# Patient Record
Sex: Female | Born: 1945 | Race: White | Hispanic: No | Smoking: Never smoker
Health system: Southern US, Community
[De-identification: ages and names within clinical notes are randomized; demographics above are authoritative.]

## PROBLEM LIST (undated history)

## (undated) DIAGNOSIS — I10 Essential (primary) hypertension: Secondary | ICD-10-CM

## (undated) DIAGNOSIS — E039 Hypothyroidism, unspecified: Secondary | ICD-10-CM

## (undated) DIAGNOSIS — R002 Palpitations: Secondary | ICD-10-CM

## (undated) DIAGNOSIS — I4891 Unspecified atrial fibrillation: Secondary | ICD-10-CM

## (undated) DIAGNOSIS — I499 Cardiac arrhythmia, unspecified: Secondary | ICD-10-CM

## (undated) DIAGNOSIS — R0600 Dyspnea, unspecified: Secondary | ICD-10-CM

## (undated) DIAGNOSIS — E119 Type 2 diabetes mellitus without complications: Secondary | ICD-10-CM

## (undated) DIAGNOSIS — R609 Edema, unspecified: Secondary | ICD-10-CM

## (undated) HISTORY — PX: OTHER SURGICAL HISTORY: SHX169

## (undated) HISTORY — PX: TONSILLECTOMY: SUR1361

## (undated) HISTORY — PX: ABDOMINAL HYSTERECTOMY: SHX81

---

## 2016-06-06 ENCOUNTER — Ambulatory Visit: Admit: 2016-06-06 | Payer: Self-pay | Admitting: Ophthalmology

## 2016-06-06 SURGERY — PHACOEMULSIFICATION, CATARACT, WITH IOL INSERTION
Anesthesia: Choice | Laterality: Left

## 2016-07-16 ENCOUNTER — Encounter: Payer: Self-pay | Admitting: *Deleted

## 2016-07-17 NOTE — H&P (Signed)
See scanned note.

## 2016-07-18 ENCOUNTER — Encounter
Admission: RE | Disposition: A | Discharge status: Home / Self Care | Payer: Self-pay | Source: Ambulatory Visit | Attending: Ophthalmology

## 2016-07-18 ENCOUNTER — Ambulatory Visit
Admission: RE | Admit: 2016-07-18 | Discharge: 2016-07-18 | Disposition: A | Discharge status: Home / Self Care | Payer: Medicare Other | Source: Ambulatory Visit | Attending: Ophthalmology | Admitting: Ophthalmology

## 2016-07-18 ENCOUNTER — Ambulatory Visit: Payer: Medicare Other | Admitting: *Deleted

## 2016-07-18 DIAGNOSIS — E1036 Type 1 diabetes mellitus with diabetic cataract: Secondary | ICD-10-CM | POA: Insufficient documentation

## 2016-07-18 HISTORY — DX: Edema, unspecified: R60.9

## 2016-07-18 HISTORY — DX: Essential (primary) hypertension: I10

## 2016-07-18 HISTORY — DX: Hypothyroidism, unspecified: E03.9

## 2016-07-18 HISTORY — DX: Type 2 diabetes mellitus without complications: E11.9

## 2016-07-18 HISTORY — PX: CATARACT EXTRACTION W/PHACO: SHX586

## 2016-07-18 HISTORY — DX: Palpitations: R00.2

## 2016-07-18 HISTORY — DX: Cardiac arrhythmia, unspecified: I49.9

## 2016-07-18 LAB — GLUCOSE, CAPILLARY: Glucose-Capillary: 116 mg/dL — ABNORMAL HIGH (ref 65–99)

## 2016-07-18 SURGERY — PHACOEMULSIFICATION, CATARACT, WITH IOL INSERTION
Anesthesia: General | Site: Eye | Laterality: Left | Wound class: Clean

## 2016-07-18 MED ORDER — POVIDONE-IODINE 5 % OP SOLN
OPHTHALMIC | Status: DC | PRN
  Administered 2016-07-18: 1 via OPHTHALMIC

## 2016-07-18 MED ORDER — MIDAZOLAM HCL 2 MG/2ML IJ SOLN
INTRAMUSCULAR | Status: AC
  Filled 2016-07-18: qty 2

## 2016-07-18 MED ORDER — POVIDONE-IODINE 5 % OP SOLN
OPHTHALMIC | Status: AC
Start: 2016-07-18 — End: 2016-07-18
  Filled 2016-07-18: qty 30

## 2016-07-18 MED ORDER — CYCLOPENTOLATE HCL 2 % OP SOLN
OPHTHALMIC | Status: AC
  Filled 2016-07-18: qty 2

## 2016-07-18 MED ORDER — NA CHONDROIT SULF-NA HYALURON 40-17 MG/ML IO SOLN
INTRAOCULAR | Status: DC | PRN
  Administered 2016-07-18: 1 mL via INTRAOCULAR

## 2016-07-18 MED ORDER — MIDAZOLAM HCL 2 MG/2ML IJ SOLN
INTRAMUSCULAR | Status: DC | PRN
  Administered 2016-07-18 (×4): 0.5 mg via INTRAVENOUS

## 2016-07-18 MED ORDER — PHENYLEPHRINE HCL 10 % OP SOLN
1.0000 [drp] | OPHTHALMIC | Status: AC
  Administered 2016-07-18 (×4): 1 [drp] via OPHTHALMIC

## 2016-07-18 MED ORDER — CYCLOPENTOLATE HCL 2 % OP SOLN
1.0000 [drp] | OPHTHALMIC | Status: AC
  Administered 2016-07-18 (×4): 1 [drp] via OPHTHALMIC

## 2016-07-18 MED ORDER — ONDANSETRON HCL 4 MG/2ML IJ SOLN
4.0000 mg | Freq: Once | INTRAMUSCULAR | Status: DC | PRN
Start: 2016-07-18 — End: 2016-07-18

## 2016-07-18 MED ORDER — LIDOCAINE HCL (PF) 4 % IJ SOLN
INTRAOCULAR | Status: DC | PRN
  Administered 2016-07-18: 2.25 mL via OPHTHALMIC

## 2016-07-18 MED ORDER — CEFUROXIME OPHTHALMIC INJECTION 1 MG/0.1 ML
INJECTION | OPHTHALMIC | Status: AC
  Filled 2016-07-18: qty 0.1

## 2016-07-18 MED ORDER — FENTANYL CITRATE (PF) 100 MCG/2ML IJ SOLN: 25.0000 ug | INTRAMUSCULAR | Status: DC | PRN

## 2016-07-18 MED ORDER — CEFUROXIME OPHTHALMIC INJECTION 1 MG/0.1 ML
INJECTION | OPHTHALMIC | Status: DC | PRN
  Administered 2016-07-18: .1 mL via INTRACAMERAL

## 2016-07-18 MED ORDER — TETRACAINE HCL 0.5 % OP SOLN
OPHTHALMIC | Status: AC
  Filled 2016-07-18: qty 2

## 2016-07-18 MED ORDER — EPINEPHRINE PF 1 MG/ML IJ SOLN
INTRAMUSCULAR | Status: DC | PRN
  Administered 2016-07-18: 1 mL via OPHTHALMIC

## 2016-07-18 MED ORDER — EPINEPHRINE PF 1 MG/ML IJ SOLN
INTRAMUSCULAR | Status: AC
  Filled 2016-07-18: qty 2

## 2016-07-18 MED ORDER — LIDOCAINE HCL (PF) 4 % IJ SOLN
INTRAMUSCULAR | Status: DC | PRN
  Administered 2016-07-18: 4 mL via OPHTHALMIC

## 2016-07-18 MED ORDER — MOXIFLOXACIN HCL 0.5 % OP SOLN
1.0000 [drp] | OPHTHALMIC | Status: AC
  Administered 2016-07-18 (×3): 1 [drp] via OPHTHALMIC

## 2016-07-18 MED ORDER — PHENYLEPHRINE HCL 10 % OP SOLN
OPHTHALMIC | Status: AC
  Administered 2016-07-18: 1 [drp] via OPHTHALMIC
  Filled 2016-07-18: qty 5

## 2016-07-18 MED ORDER — CARBACHOL 0.01 % IO SOLN
INTRAOCULAR | Status: DC | PRN
  Administered 2016-07-18: .5 mL via INTRAOCULAR

## 2016-07-18 MED ORDER — NA CHONDROIT SULF-NA HYALURON 40-17 MG/ML IO SOLN
INTRAOCULAR | Status: AC
  Filled 2016-07-18: qty 1

## 2016-07-18 MED ORDER — MOXIFLOXACIN HCL 0.5 % OP SOLN
OPHTHALMIC | Status: DC | PRN
  Administered 2016-07-18: 1 [drp] via OPHTHALMIC

## 2016-07-18 MED ORDER — SODIUM CHLORIDE 0.9 % IV SOLN
INTRAVENOUS | Status: DC
  Administered 2016-07-18 (×2): via INTRAVENOUS

## 2016-07-18 MED ORDER — MOXIFLOXACIN HCL 0.5 % OP SOLN
OPHTHALMIC | Status: AC
  Administered 2016-07-18: 1 [drp] via OPHTHALMIC
  Filled 2016-07-18: qty 3

## 2016-07-18 MED ORDER — ALFENTANIL 500 MCG/ML IJ INJ
INJECTION | INTRAMUSCULAR | Status: DC | PRN
  Administered 2016-07-18 (×2): 250 ug via INTRAVENOUS

## 2016-07-18 MED ORDER — HYALURONIDASE HUMAN 150 UNIT/ML IJ SOLN
INTRAMUSCULAR | Status: AC
  Filled 2016-07-18: qty 1

## 2016-07-18 MED ORDER — BUPIVACAINE HCL (PF) 0.75 % IJ SOLN
INTRAMUSCULAR | Status: AC
  Filled 2016-07-18: qty 10

## 2016-07-18 MED ORDER — TETRACAINE HCL 0.5 % OP SOLN
OPHTHALMIC | Status: DC | PRN
  Administered 2016-07-18: 1 [drp] via OPHTHALMIC

## 2016-07-18 SURGICAL SUPPLY — 29 items

## 2016-07-18 NOTE — Transfer of Care (Signed)
Immediate Anesthesia Transfer of Care Note  Patient: Melinda Rodriguez  Procedure(s) Performed: Procedure(s) with comments: CATARACT EXTRACTION PHACO AND INTRAOCULAR LENS PLACEMENT (IOC) (Left) - [place] 01:36 AP% 22.9 CDE 40.37 Fluid pack lot # [id number]  Patient Location: PACU  Anesthesia Type:MAC  Level of Consciousness: awake, alert  and oriented  Airway & Oxygen Therapy: Patient Spontanous Breathing  Post-op Assessment: Report given to RN  Post vital signs: Reviewed and stable  Last Vitals:  Vitals:   07/18/16 0735  BP: 125/72  Pulse: 89  Resp: 18  Temp: 36.1 C    Last Pain:  Vitals:   07/18/16 0735  TempSrc: Tympanic         Complications: No apparent anesthesia complications

## 2016-07-18 NOTE — Interval H&P Note (Signed)
History and Physical Interval Note:  07/18/2016 9:07 AM  Melinda Rodriguez  has presented today for surgery, with the diagnosis of cataract  The various methods of treatment have been discussed with the patient and family. After consideration of risks, benefits and other options for treatment, the patient has consented to  Procedure(s): CATARACT EXTRACTION PHACO AND INTRAOCULAR LENS PLACEMENT (IOC) (Left) as a surgical intervention .  The patient's history has been reviewed, patient examined, no change in status, stable for surgery.  I have reviewed the patient's chart and labs.  Questions were answered to the patient's satisfaction.     Krisann Mckenna

## 2016-07-18 NOTE — Anesthesia Preprocedure Evaluation (Addendum)
Anesthesia Evaluation  Patient identified by MRN, date of birth, ID band Patient awake    Reviewed: Allergy & Precautions, NPO status , Patient's Chart, lab work & pertinent test results  Airway Mallampati: III       Dental  (+) Teeth Intact   Pulmonary neg pulmonary ROS,           Cardiovascular hypertension, Pt. on medications and Pt. on home beta blockers + dysrhythmias Atrial Fibrillation  Rhythm:Irregular     Neuro/Psych negative neurological ROS  negative psych ROS   GI/Hepatic negative GI ROS, Neg liver ROS,   Endo/Other  diabetes, Type 1, Insulin DependentMorbid obesity  Renal/GU negative Renal ROS     Musculoskeletal   Abdominal (+) + obese,   Peds  Hematology negative hematology ROS (+)   Anesthesia Other Findings   Reproductive/Obstetrics                             Anesthesia Physical Anesthesia Plan  ASA: III  Anesthesia Plan: General   Post-op Pain Management:    Induction: Intravenous  Airway Management Planned: Natural Airway and Nasal Cannula  Additional Equipment:   Intra-op Plan:   Post-operative Plan:   Informed Consent: I have reviewed the patients History and Physical, chart, labs and discussed the procedure including the risks, benefits and alternatives for the proposed anesthesia with the patient or authorized representative who has indicated his/her understanding and acceptance.     Plan Discussed with: CRNA  Anesthesia Plan Comments:         Anesthesia Quick Evaluation

## 2016-07-18 NOTE — OR Nursing (Signed)
One drop of Neosynephrine given in right eye.  Dr Dingeldein notified.  No new orders at this time.

## 2016-07-18 NOTE — Op Note (Signed)
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 Item Serial No. Manufacturer Lot No. LRB No. Used  LENS IOL ACRYSOF IQ 20.0 - [id number] Intraocular Lens LENS IOL ACRYSOF IQ 20.0 [id number] ALCON   Left 1   Anesthesia: 2% Lidocaine and 4% Marcaine in a 50/50 mixture with 10 unites/ml of Hylenex given as a peribulbar Anesthesiologist: Anesthesiologist: Lezlie OctaveGijsbertus F Van Staveren, MD CRNA: Edyth Gunnelslyde Gilbert Complications: none Estimated Blood Loss: less than 1 ml  Description of procedure:  The patient was given anesthesia and sedation via intravenous access. The patient was then prepped and draped in the usual fashion. A 25-gauge needle was bent for initiating the capsulorhexis. A 5-0 silk suture was placed through the conjunctiva superior and inferiorly to serve as bridle sutures. Hemostasis was obtained at the superior limbus using an eraser cautery. A partial thickness groove was made at the anterior surgical limbus with a 64 Beaver blade and this was dissected anteriorly with an SYSCOlcon Crescent knife. The anterior chamber was entered at 10 o'clock with a 1.0 mm paracentesis knife and through the lamellar dissection with a 2.6 mm Alcon keratome. Epi-Shugarcaine 0.5 CC [9 cc BSS Plus (Alcon), 3 cc 4% preservative-free lidocaine (Hospira) and 4 cc 1:1000 preservative-free, bisulfite-free epinephrine] was injected into the anterior chamber via the paracentesis tract. Epi-Shugarcaine 0.5 CC [9 cc BSS Plus (Alcon), 3 cc 4% preservative-free lidocaine (Hospira) and 4 cc 1:1000 preservative-free, bisulfite-free epinephrine] was injected into the anterior chamber via the paracentesis tract. DiscoVisc was injected to  replace the aqueous and a continuous tear curvilinear capsulorhexis was performed using a bent 25-gauge needle.  Balance salt on a syringe was used to perform hydro-dissection and phacoemulsification was carried out using a divide and conquer technique. Procedure(s) with comments: CATARACT EXTRACTION PHACO AND INTRAOCULAR LENS PLACEMENT (IOC) (Left) - [place] 01:36 AP% 22.9 CDE 40.37 Fluid pack lot # [id number]. Irrigation/aspiration was used to remove the residual cortex and the capsular bag was inflated with DiscoVisc. The intraocular lens was inserted into the capsular bag using a pre-loaded UltraSert Delivery System. Irrigation/aspiration was used to remove the residual DiscoVisc. The wound was inflated with balanced salt and checked for leaks. None were found. Miostat was injected via the paracentesis track and 0.1 ml of cefuroxime containing 1 mg of drug  was injected via the paracentesis track. The wound was checked for leaks again and none were found.   The bridal sutures were removed and two drops of Vigamox were placed on the eye. An eye shield was placed to protect the eye and the patient was discharged to the recovery area in good condition.   Coriana Angello MD

## 2016-07-18 NOTE — Discharge Instructions (Signed)
FOLLOW CATARACT SURGERY INSTRUCTION SHEET AS REVIEWED.   Eye Surgery Discharge Instructions  Expect mild scratchy sensation or mild soreness. DO NOT RUB YOUR EYE!  The day of surgery:  Minimal physical activity, but bed rest is not required  No reading, computer work, or close hand work  No bending, lifting, or straining.  May watch TV  For 24 hours:  No driving, legal decisions, or alcoholic beverages  Safety precautions  Eat anything you prefer: It is better to start with liquids, then soup then solid foods.  _____ Eye patch should be worn until postoperative exam tomorrow.  ____ Solar shield eyeglasses should be worn for comfort in the sunlight/patch while sleeping  Resume all regular medications including aspirin or Coumadin if these were discontinued prior to surgery. You may shower, bathe, shave, or wash your hair. Tylenol may be taken for mild discomfort.  Call your doctor if you experience significant pain, nausea, or vomiting, fever > 101 or other signs of infection. [phone] or [phone] Specific instructions:  Follow-up Information    DINGELDEIN,STEVEN, MD Follow up.   Specialty:  Ophthalmology Why:  Thursday 07/19/16 @ 9:10 am Contact information: [address]   [place] [address] [phone]

## 2016-07-18 NOTE — Anesthesia Post-op Follow-up Note (Cosign Needed)
Anesthesia QCDR form completed.        

## 2016-07-18 NOTE — Anesthesia Postprocedure Evaluation (Signed)
Anesthesia Post Note  Patient: Melinda Rodriguez  Procedure(s) Performed: Procedure(s) (LRB): CATARACT EXTRACTION PHACO AND INTRAOCULAR LENS PLACEMENT (IOC) (Left)  Patient location during evaluation: PACU Anesthesia Type: MAC Level of consciousness: awake Pain management: pain level controlled Vital Signs Assessment: post-procedure vital signs reviewed and stable Respiratory status: spontaneous breathing Cardiovascular status: stable Anesthetic complications: no     Last Vitals:  Vitals:   07/18/16 0955 07/18/16 1012  BP: 133/63 119/64  Pulse: 82 78  Resp: 16 16  Temp: 36.6 C     Last Pain:  Vitals:   07/18/16 0955  TempSrc: Tympanic                 VAN STAVEREN,Soffia Doshier

## 2016-12-04 ENCOUNTER — Encounter: Payer: Self-pay | Admitting: *Deleted

## 2016-12-11 NOTE — H&P (Signed)
See scanned note.

## 2016-12-12 ENCOUNTER — Encounter
Admission: RE | Disposition: A | Discharge status: Home / Self Care | Payer: Self-pay | Source: Ambulatory Visit | Attending: Ophthalmology

## 2016-12-12 ENCOUNTER — Encounter: Payer: Self-pay | Admitting: *Deleted

## 2016-12-12 ENCOUNTER — Ambulatory Visit: Payer: Medicare Other | Admitting: Anesthesiology

## 2016-12-12 ENCOUNTER — Ambulatory Visit
Admission: RE | Admit: 2016-12-12 | Discharge: 2016-12-12 | Disposition: A | Discharge status: Home / Self Care | Payer: Medicare Other | Source: Ambulatory Visit | Attending: Ophthalmology | Admitting: Ophthalmology

## 2016-12-12 DIAGNOSIS — I10 Essential (primary) hypertension: Secondary | ICD-10-CM | POA: Insufficient documentation

## 2016-12-12 DIAGNOSIS — E1136 Type 2 diabetes mellitus with diabetic cataract: Secondary | ICD-10-CM | POA: Insufficient documentation

## 2016-12-12 DIAGNOSIS — I4891 Unspecified atrial fibrillation: Secondary | ICD-10-CM | POA: Diagnosis not present

## 2016-12-12 DIAGNOSIS — Z90710 Acquired absence of both cervix and uterus: Secondary | ICD-10-CM | POA: Diagnosis not present

## 2016-12-12 DIAGNOSIS — Z9849 Cataract extraction status, unspecified eye: Secondary | ICD-10-CM | POA: Insufficient documentation

## 2016-12-12 DIAGNOSIS — E079 Disorder of thyroid, unspecified: Secondary | ICD-10-CM | POA: Diagnosis not present

## 2016-12-12 DIAGNOSIS — E7800 Pure hypercholesterolemia, unspecified: Secondary | ICD-10-CM | POA: Insufficient documentation

## 2016-12-12 DIAGNOSIS — H2511 Age-related nuclear cataract, right eye: Secondary | ICD-10-CM | POA: Diagnosis present

## 2016-12-12 DIAGNOSIS — H25011 Cortical age-related cataract, right eye: Secondary | ICD-10-CM | POA: Insufficient documentation

## 2016-12-12 DIAGNOSIS — Z6839 Body mass index (BMI) 39.0-39.9, adult: Secondary | ICD-10-CM | POA: Insufficient documentation

## 2016-12-12 HISTORY — DX: Dyspnea, unspecified: R06.00

## 2016-12-12 HISTORY — PX: CATARACT EXTRACTION W/PHACO: SHX586

## 2016-12-12 LAB — GLUCOSE, CAPILLARY: GLUCOSE-CAPILLARY: 160 mg/dL — AB (ref 65–99)

## 2016-12-12 SURGERY — PHACOEMULSIFICATION, CATARACT, WITH IOL INSERTION
Anesthesia: General | Site: Eye | Laterality: Right | Wound class: Clean

## 2016-12-12 MED ORDER — TETRACAINE HCL 0.5 % OP SOLN
OPHTHALMIC | Status: AC
  Filled 2016-12-12: qty 4

## 2016-12-12 MED ORDER — MOXIFLOXACIN HCL 0.5 % OP SOLN
OPHTHALMIC | Status: AC
  Administered 2016-12-12: 1 [drp] via OPHTHALMIC
  Filled 2016-12-12: qty 3

## 2016-12-12 MED ORDER — PHENYLEPHRINE HCL 10 % OP SOLN
1.0000 [drp] | OPHTHALMIC | Status: DC
  Administered 2016-12-12 (×4): 1 [drp] via OPHTHALMIC

## 2016-12-12 MED ORDER — POVIDONE-IODINE 5 % OP SOLN
OPHTHALMIC | Status: AC
  Filled 2016-12-12: qty 30

## 2016-12-12 MED ORDER — MIDAZOLAM HCL 2 MG/2ML IJ SOLN
INTRAMUSCULAR | Status: AC
  Filled 2016-12-12: qty 2

## 2016-12-12 MED ORDER — TETRACAINE HCL 0.5 % OP SOLN
OPHTHALMIC | Status: DC | PRN
  Administered 2016-12-12: 2 [drp] via OPHTHALMIC

## 2016-12-12 MED ORDER — LIDOCAINE HCL (PF) 2 % IJ SOLN
INTRAMUSCULAR | Status: AC
  Filled 2016-12-12: qty 2

## 2016-12-12 MED ORDER — SODIUM CHLORIDE 0.9 % IV SOLN
INTRAVENOUS | Status: DC
  Administered 2016-12-12: 07:00:00 via INTRAVENOUS

## 2016-12-12 MED ORDER — EPINEPHRINE PF 1 MG/ML IJ SOLN
INTRAMUSCULAR | Status: AC
  Filled 2016-12-12: qty 1

## 2016-12-12 MED ORDER — PHENYLEPHRINE HCL 10 % OP SOLN
OPHTHALMIC | Status: AC
  Administered 2016-12-12: 1 [drp] via OPHTHALMIC
  Filled 2016-12-12: qty 5

## 2016-12-12 MED ORDER — MOXIFLOXACIN HCL 0.5 % OP SOLN
OPHTHALMIC | Status: DC | PRN
  Administered 2016-12-12: 1 [drp] via OPHTHALMIC

## 2016-12-12 MED ORDER — CYCLOPENTOLATE HCL 2 % OP SOLN
1.0000 [drp] | OPHTHALMIC | Status: AC
  Administered 2016-12-12 (×4): 1 [drp] via OPHTHALMIC

## 2016-12-12 MED ORDER — POVIDONE-IODINE 5 % OP SOLN
OPHTHALMIC | Status: DC | PRN
  Administered 2016-12-12: 1 via OPHTHALMIC

## 2016-12-12 MED ORDER — NA CHONDROIT SULF-NA HYALURON 40-17 MG/ML IO SOLN
INTRAOCULAR | Status: AC
  Filled 2016-12-12: qty 1

## 2016-12-12 MED ORDER — NA CHONDROIT SULF-NA HYALURON 40-17 MG/ML IO SOLN
INTRAOCULAR | Status: DC | PRN
  Administered 2016-12-12: 1 mL via INTRAOCULAR

## 2016-12-12 MED ORDER — PROPOFOL 10 MG/ML IV BOLUS
INTRAVENOUS | Status: AC
  Filled 2016-12-12: qty 20

## 2016-12-12 MED ORDER — MOXIFLOXACIN HCL 0.5 % OP SOLN
1.0000 [drp] | OPHTHALMIC | Status: AC
  Administered 2016-12-12 (×3): 1 [drp] via OPHTHALMIC

## 2016-12-12 MED ORDER — CEFUROXIME OPHTHALMIC INJECTION 1 MG/0.1 ML
INJECTION | OPHTHALMIC | Status: DC | PRN
  Administered 2016-12-12: 0.1 mL via INTRACAMERAL

## 2016-12-12 MED ORDER — HYALURONIDASE HUMAN 150 UNIT/ML IJ SOLN
INTRAMUSCULAR | Status: AC
  Filled 2016-12-12: qty 1

## 2016-12-12 MED ORDER — ALFENTANIL 500 MCG/ML IJ INJ
INJECTION | INTRAVENOUS | Status: AC
  Filled 2016-12-12: qty 5

## 2016-12-12 MED ORDER — EPINEPHRINE PF 1 MG/ML IJ SOLN
INTRAOCULAR | Status: DC | PRN
  Administered 2016-12-12: 200 mL via OPHTHALMIC

## 2016-12-12 MED ORDER — LIDOCAINE HCL (PF) 4 % IJ SOLN
INTRAMUSCULAR | Status: DC | PRN
  Administered 2016-12-12: 4 mL via OPHTHALMIC

## 2016-12-12 MED ORDER — CYCLOPENTOLATE HCL 2 % OP SOLN
OPHTHALMIC | Status: AC
  Administered 2016-12-12: 1 [drp] via OPHTHALMIC
  Filled 2016-12-12: qty 2

## 2016-12-12 MED ORDER — ALFENTANIL 500 MCG/ML IJ INJ
INJECTION | INTRAVENOUS | Status: DC | PRN
  Administered 2016-12-12 (×2): 250 ug via INTRAVENOUS

## 2016-12-12 MED ORDER — GLYCOPYRROLATE 0.2 MG/ML IJ SOLN
INTRAMUSCULAR | Status: AC
Start: 2016-12-12 — End: 2016-12-12
  Filled 2016-12-12: qty 1

## 2016-12-12 MED ORDER — LIDOCAINE HCL (PF) 4 % IJ SOLN
INTRAMUSCULAR | Status: AC
  Filled 2016-12-12: qty 5

## 2016-12-12 MED ORDER — LIDOCAINE HCL (PF) 4 % IJ SOLN
INTRAOCULAR | Status: DC | PRN
  Administered 2016-12-12: 4 mL via OPHTHALMIC

## 2016-12-12 MED ORDER — CARBACHOL 0.01 % IO SOLN
INTRAOCULAR | Status: DC | PRN
  Administered 2016-12-12: 0.5 mL via INTRAOCULAR

## 2016-12-12 SURGICAL SUPPLY — 24 items
CORD BIP STRL DISP 12FT (MISCELLANEOUS) ×3 IMPLANT
DRAPE XRAY CASSETTE 23X24 (DRAPES) ×3 IMPLANT
ERASER HMR WETFIELD 18G (MISCELLANEOUS) ×3 IMPLANT
GLOVE BIO SURGEON STRL SZ8 (GLOVE) ×3 IMPLANT
GLOVE SURG LX 6.5 MICRO (GLOVE) ×2
GLOVE SURG LX 8.0 MICRO (GLOVE) ×2
GLOVE SURG LX STRL 6.5 MICRO (GLOVE) ×1 IMPLANT
GLOVE SURG LX STRL 8.0 MICRO (GLOVE) ×1 IMPLANT
GOWN STRL REUS W/ TWL LRG LVL3 (GOWN DISPOSABLE) ×1 IMPLANT
GOWN STRL REUS W/ TWL XL LVL3 (GOWN DISPOSABLE) ×1 IMPLANT
GOWN STRL REUS W/TWL LRG LVL3 (GOWN DISPOSABLE) ×2
GOWN STRL REUS W/TWL XL LVL3 (GOWN DISPOSABLE) ×2
LABEL CATARACT MEDS ST (LABEL) ×3 IMPLANT
LENS IOL ACRYSOF IQ 21.0 (Intraocular Lens) ×3 IMPLANT
PACK CATARACT (MISCELLANEOUS) ×3 IMPLANT
PACK CATARACT DINGLEDEIN LX (MISCELLANEOUS) ×3 IMPLANT
PACK EYE AFTER SURG (MISCELLANEOUS) ×3 IMPLANT
SHLD EYE VISITEC  UNIV (MISCELLANEOUS) ×3 IMPLANT
SOL BSS BAG (MISCELLANEOUS) ×3
SOLUTION BSS BAG (MISCELLANEOUS) ×1 IMPLANT
SUT SILK 5-0 (SUTURE) ×3 IMPLANT
SYR 5ML LL (SYRINGE) ×3 IMPLANT
WATER STERILE IRR 250ML POUR (IV SOLUTION) ×3 IMPLANT
WIPE NON LINTING 3.25X3.25 (MISCELLANEOUS) ×3 IMPLANT

## 2016-12-12 NOTE — Interval H&P Note (Signed)
History and Physical Interval Note:  12/12/2016 7:27 AM  Melinda Rodriguez  has presented today for surgery, with the diagnosis of cataract  The various methods of treatment have been discussed with the patient and family. After consideration of risks, benefits and other options for treatment, the patient has consented to  Procedure(s): CATARACT EXTRACTION PHACO AND INTRAOCULAR LENS PLACEMENT (IOC) (Right) as a surgical intervention .  The patient's history has been reviewed, patient examined, no change in status, stable for surgery.  I have reviewed the patient's chart and labs.  Questions were answered to the patient's satisfaction.     Gailya Tauer

## 2016-12-12 NOTE — Anesthesia Postprocedure Evaluation (Signed)
Anesthesia Post Note  Patient: Melinda Rodriguez  Procedure(s) Performed: Procedure(s) (LRB): CATARACT EXTRACTION PHACO AND INTRAOCULAR LENS PLACEMENT (IOC) (Right)  Patient location during evaluation: PACU Anesthesia Type: General Level of consciousness: awake and alert Pain management: pain level controlled Vital Signs Assessment: post-procedure vital signs reviewed and stable Respiratory status: spontaneous breathing, nonlabored ventilation, respiratory function stable and patient connected to nasal cannula oxygen Cardiovascular status: stable and blood pressure returned to baseline Anesthetic complications: no     Last Vitals:  Vitals:   12/12/16 0845 12/12/16 0854  BP: (!) 78/47 101/63  Pulse: 67 64  Resp:  16  Temp:      Last Pain:  Vitals:   12/12/16 0830  TempSrc: Tympanic                 Cleda MccreedyJoseph K Piscitello

## 2016-12-12 NOTE — Transfer of Care (Signed)
Immediate Anesthesia Transfer of Care Note  Patient: Melinda Rodriguez  Procedure(s) Performed: Procedure(s) with comments: CATARACT EXTRACTION PHACO AND INTRAOCULAR LENS PLACEMENT (IOC) (Right) - Lot# [id number] H [place]: 01:20.6 AP%: 21.0 CDE: 35.09  Patient Location: PACU and Short Stay  Anesthesia Type:MAC  Level of Consciousness: awake, oriented and patient cooperative  Airway & Oxygen Therapy: Patient Spontanous Breathing and Patient connected to nasal cannula oxygen  Post-op Assessment: Report given to RN and Post -op Vital signs reviewed and stable  Post vital signs: Reviewed and stable  Last Vitals:  Vitals:   12/12/16 0616  BP: (!) 107/56  Pulse: (!) 54  Resp: (!) 22  Temp: (!) 36.3 C    Last Pain:  Vitals:   12/12/16 0616  TempSrc: Tympanic         Complications: No apparent anesthesia complications

## 2016-12-12 NOTE — Anesthesia Preprocedure Evaluation (Addendum)
Anesthesia Evaluation  Patient identified by MRN, date of birth, ID band Patient awake    Reviewed: Allergy & Precautions, NPO status , Patient's Chart, lab work & pertinent test results  Airway Mallampati: III       Dental  (+) Teeth Intact   Pulmonary neg pulmonary ROS,           Cardiovascular hypertension, Pt. on medications and Pt. on home beta blockers + dysrhythmias Atrial Fibrillation  Rhythm:Irregular     Neuro/Psych negative neurological ROS  negative psych ROS   GI/Hepatic negative GI ROS, Neg liver ROS,   Endo/Other  diabetes, Type 1, Insulin DependentHypothyroidism Morbid obesity  Renal/GU negative Renal ROS     Musculoskeletal   Abdominal (+) + obese,   Peds  Hematology negative hematology ROS (+)   Anesthesia Other Findings Past Medical History: No date: Diabetes mellitus without complication (HCC) No date: Dyspnea     Comment:  DOE No date: Dysrhythmia     Comment:  AFIB No date: Edema No date: Hypertension No date: Hypothyroidism No date: Palpitations  BMI    Body Mass Index:  39.82 kg/m      Reproductive/Obstetrics                             Anesthesia Physical  Anesthesia Plan  ASA: III  Anesthesia Plan: MAC   Post-op Pain Management:    Induction: Intravenous  PONV Risk Score and Plan:   Airway Management Planned: Natural Airway and Nasal Cannula  Additional Equipment:   Intra-op Plan:   Post-operative Plan:   Informed Consent: I have reviewed the patients History and Physical, chart, labs and discussed the procedure including the risks, benefits and alternatives for the proposed anesthesia with the patient or authorized representative who has indicated his/her understanding and acceptance.     Plan Discussed with: CRNA  Anesthesia Plan Comments:        Anesthesia Quick Evaluation

## 2016-12-12 NOTE — Discharge Instructions (Addendum)
Eye Surgery Discharge Instructions  Expect mild scratchy sensation or mild soreness. DO NOT RUB YOUR EYE!  The day of surgery:  Minimal physical activity, but bed rest is not required  No reading, computer work, or close hand work  No bending, lifting, or straining.  May watch TV  For 24 hours:  No driving, legal decisions, or alcoholic beverages  Safety precautions  Eat anything you prefer: It is better to start with liquids, then soup then solid foods.  _____ Eye patch should be worn until postoperative exam tomorrow.  ____ Solar shield eyeglasses should be worn for comfort in the sunlight/patch while sleeping  Resume all regular medications including aspirin or Coumadin if these were discontinued prior to surgery. You may shower, bathe, shave, or wash your hair. Tylenol may be taken for mild discomfort.  Call your doctor if you experience significant pain, nausea, or vomiting, fever > 101 or other signs of infection. [phone] or [phone] Specific instructions:  Follow-up Information    Dingeldein, Viviann SpareSteven, MD Follow up on 12/13/2016.   Specialty:  Ophthalmology Why:  follow up appointment in the office at 9:45 am Contact information: [address]   [place] [address] [phone]         See handout.

## 2016-12-12 NOTE — Op Note (Signed)
Date of Surgery: 12/12/2016 Date of Dictation: 12/12/2016 8:28 AM Pre-operative Diagnosis:  Nuclear Sclerotic Cataract and Cortical Cataract right Eye Post-operative Diagnosis: same Procedure performed: Extra-capsular Cataract Extraction (ECCE) with placement of a posterior chamber intraocular lens (IOL) right Eye IOL:  Implant Name Type Inv. Item Serial No. Manufacturer Lot No. LRB No. Used  LENS IOL ACRYSOF IQ 21.0 - [id number] Intraocular Lens LENS IOL ACRYSOF IQ 21.0 [id number] ALCON   Right 1   Anesthesia: 2% Lidocaine and 4% Marcaine in a 50/50 mixture with 10 unites/ml of Hylenex given as a peribulbar Anesthesiologist: Anesthesiologist: Piscitello, Cleda MccreedyJoseph K, MD CRNA: Charna Busmaniamond, Thomas, CRNA Complications: none Estimated Blood Loss: less than 1 ml  Description of procedure:  The patient was given anesthesia and sedation via intravenous access. The patient was then prepped and draped in the usual fashion. A 25-gauge needle was bent for initiating the capsulorhexis. A 5-0 silk suture was placed through the conjunctiva superior and inferiorly to serve as bridle sutures. Hemostasis was obtained at the superior limbus using an eraser cautery. A partial thickness groove was made at the anterior surgical limbus with a 64 Beaver blade and this was dissected anteriorly with an SYSCOlcon Crescent knife. The anterior chamber was entered at 10 o'clock with a 1.0 mm paracentesis knife and through the lamellar dissection with a 2.6 mm Alcon keratome. Epi-Shugarcaine 0.5 CC [9 cc BSS Plus (Alcon), 3 cc 4% preservative-free lidocaine (Hospira) and 4 cc 1:1000 preservative-free, bisulfite-free epinephrine] was injected into the anterior chamber via the paracentesis tract. Epi-Shugarcaine 0.5 CC [9 cc BSS Plus (Alcon), 3 cc 4% preservative-free lidocaine (Hospira) and 4 cc 1:1000 preservative-free, bisulfite-free epinephrine] was injected into the anterior chamber via the paracentesis tract. DiscoVisc was injected  to replace the aqueous and a continuous tear curvilinear capsulorhexis was performed using a bent 25-gauge needle.  Balance salt on a syringe was used to perform hydro-dissection and phacoemulsification was carried out using a divide and conquer technique. Procedure(s) with comments: CATARACT EXTRACTION PHACO AND INTRAOCULAR LENS PLACEMENT (IOC) (Right) - Lot# [id number] H US: 01:20.6 AP%: 21.0 CDE: 35.09. Irrigation/aspiration was used to remove the residual cortex and the capsular bag was inflated with DiscoVisc. The intraocular lens was inserted into the capsular bag using a pre-loaded UltraSert Delivery System. Irrigation/aspiration was used to remove the residual DiscoVisc. The wound was inflated with balanced salt and checked for leaks. None were found. Miostat was injected via the paracentesis track and 0.1 ml of cefuroxime containing 1 mg of drug  was injected via the paracentesis track. The wound was checked for leaks again and none were found.   The bridal sutures were removed and two drops of Vigamox were placed on the eye. An eye shield was placed to protect the eye and the patient was discharged to the recovery area in good condition.   Sherie Dobrowolski MD

## 2016-12-12 NOTE — Anesthesia Post-op Follow-up Note (Cosign Needed)
Anesthesia QCDR form completed.        

## 2017-05-12 ENCOUNTER — Emergency Department: Payer: Medicare Other

## 2017-05-12 ENCOUNTER — Inpatient Hospital Stay
Admission: EM | Admit: 2017-05-12 | Discharge: 2017-05-14 | DRG: 310 | Disposition: A | Discharge status: Home / Self Care | Payer: Medicare Other | Attending: Internal Medicine | Admitting: Internal Medicine

## 2017-05-12 ENCOUNTER — Other Ambulatory Visit: Payer: Self-pay

## 2017-05-12 DIAGNOSIS — I4891 Unspecified atrial fibrillation: Secondary | ICD-10-CM | POA: Diagnosis present

## 2017-05-12 DIAGNOSIS — Z79899 Other long term (current) drug therapy: Secondary | ICD-10-CM

## 2017-05-12 DIAGNOSIS — Z7901 Long term (current) use of anticoagulants: Secondary | ICD-10-CM

## 2017-05-12 DIAGNOSIS — I110 Hypertensive heart disease with heart failure: Secondary | ICD-10-CM | POA: Diagnosis present

## 2017-05-12 DIAGNOSIS — R001 Bradycardia, unspecified: Principal | ICD-10-CM | POA: Diagnosis present

## 2017-05-12 DIAGNOSIS — I509 Heart failure, unspecified: Secondary | ICD-10-CM | POA: Diagnosis present

## 2017-05-12 DIAGNOSIS — E119 Type 2 diabetes mellitus without complications: Secondary | ICD-10-CM | POA: Diagnosis present

## 2017-05-12 DIAGNOSIS — E860 Dehydration: Secondary | ICD-10-CM | POA: Diagnosis present

## 2017-05-12 DIAGNOSIS — Z7984 Long term (current) use of oral hypoglycemic drugs: Secondary | ICD-10-CM | POA: Diagnosis not present

## 2017-05-12 DIAGNOSIS — E039 Hypothyroidism, unspecified: Secondary | ICD-10-CM | POA: Diagnosis present

## 2017-05-12 HISTORY — DX: Unspecified atrial fibrillation: I48.91

## 2017-05-12 LAB — BASIC METABOLIC PANEL
ANION GAP: 11 (ref 5–15)
BUN: 27 mg/dL — AB (ref 6–20)
CHLORIDE: 106 mmol/L (ref 101–111)
CO2: 22 mmol/L (ref 22–32)
Calcium: 8.6 mg/dL — ABNORMAL LOW (ref 8.9–10.3)
Creatinine, Ser: 1.29 mg/dL — ABNORMAL HIGH (ref 0.44–1.00)
GFR calc Af Amer: 47 mL/min — ABNORMAL LOW (ref >60)
GFR calc non Af Amer: 40 mL/min — ABNORMAL LOW (ref >60)
Glucose, Bld: 157 mg/dL — ABNORMAL HIGH (ref 65–99)
POTASSIUM: 4 mmol/L (ref 3.5–5.1)
SODIUM: 139 mmol/L (ref 135–145)

## 2017-05-12 LAB — CBC
HCT: 40.2 % (ref 35.0–47.0)
Hemoglobin: 13.8 g/dL (ref 12.0–16.0)
MCH: 31.8 pg (ref 26.0–34.0)
MCHC: 34.2 g/dL (ref 32.0–36.0)
MCV: 93 fL (ref 80.0–100.0)
Platelets: 326 10*3/uL (ref 150–440)
RBC: 4.33 MIL/uL (ref 3.80–5.20)
RDW: 13.3 % (ref 11.5–14.5)
WBC: 9.5 10*3/uL (ref 3.6–11.0)

## 2017-05-12 LAB — TROPONIN I

## 2017-05-12 LAB — URINALYSIS, COMPLETE (UACMP) WITH MICROSCOPIC
BACTERIA UA: NONE SEEN
BILIRUBIN URINE: NEGATIVE
Glucose, UA: NEGATIVE mg/dL
Hgb urine dipstick: NEGATIVE
Ketones, ur: NEGATIVE mg/dL
LEUKOCYTES UA: NEGATIVE
Nitrite: NEGATIVE
PH: 5 (ref 5.0–8.0)
Protein, ur: NEGATIVE mg/dL
SPECIFIC GRAVITY, URINE: 1.011 (ref 1.005–1.030)

## 2017-05-12 LAB — MAGNESIUM: MAGNESIUM: 1.4 mg/dL — AB (ref 1.7–2.4)

## 2017-05-12 LAB — TSH: TSH: 1.061 u[IU]/mL (ref 0.350–4.500)

## 2017-05-12 LAB — GLUCOSE, CAPILLARY
GLUCOSE-CAPILLARY: 154 mg/dL — AB (ref 65–99)
GLUCOSE-CAPILLARY: 198 mg/dL — AB (ref 65–99)
Glucose-Capillary: 194 mg/dL — ABNORMAL HIGH (ref 65–99)

## 2017-05-12 MED ORDER — LEVOTHYROXINE SODIUM 50 MCG PO TABS
75.0000 ug | ORAL_TABLET | Freq: Every day | ORAL | Status: DC
  Administered 2017-05-13: 75 ug via ORAL
  Filled 2017-05-12 (×2): qty 1

## 2017-05-12 MED ORDER — INSULIN GLARGINE 100 UNIT/ML SOLOSTAR PEN: 26.0000 [IU] | PEN_INJECTOR | Freq: Two times a day (BID) | SUBCUTANEOUS | Status: DC

## 2017-05-12 MED ORDER — METFORMIN HCL ER 500 MG PO TB24
500.0000 mg | ORAL_TABLET | Freq: Two times a day (BID) | ORAL | Status: DC
  Administered 2017-05-13: 500 mg via ORAL
  Filled 2017-05-12: qty 1

## 2017-05-12 MED ORDER — MAGNESIUM SULFATE 2 GM/50ML IV SOLN
2.0000 g | Freq: Once | INTRAVENOUS | Status: AC
  Administered 2017-05-12: 2 g via INTRAVENOUS
  Filled 2017-05-12: qty 50

## 2017-05-12 MED ORDER — INSULIN ASPART 100 UNIT/ML ~~LOC~~ SOLN
0.0000 [IU] | Freq: Three times a day (TID) | SUBCUTANEOUS | Status: DC
  Administered 2017-05-12 – 2017-05-13 (×2): 2 [IU] via SUBCUTANEOUS
  Filled 2017-05-12 (×2): qty 1

## 2017-05-12 MED ORDER — SODIUM CHLORIDE 0.9 % IV SOLN
INTRAVENOUS | Status: DC
  Administered 2017-05-12: 17:00:00 via INTRAVENOUS

## 2017-05-12 MED ORDER — APIXABAN 5 MG PO TABS
5.0000 mg | ORAL_TABLET | Freq: Two times a day (BID) | ORAL | Status: DC
  Administered 2017-05-12 – 2017-05-14 (×4): 5 mg via ORAL
  Filled 2017-05-12 (×4): qty 1

## 2017-05-12 MED ORDER — ONDANSETRON HCL 4 MG PO TABS: 4.0000 mg | ORAL_TABLET | Freq: Four times a day (QID) | ORAL | Status: DC | PRN

## 2017-05-12 MED ORDER — ONDANSETRON HCL 4 MG/2ML IJ SOLN: 4.0000 mg | Freq: Four times a day (QID) | INTRAMUSCULAR | Status: DC | PRN

## 2017-05-12 MED ORDER — VITAMIN D 1000 UNITS PO TABS
2000.0000 [IU] | ORAL_TABLET | Freq: Every day | ORAL | Status: DC
  Administered 2017-05-12 – 2017-05-14 (×3): 2000 [IU] via ORAL
  Filled 2017-05-12 (×3): qty 2

## 2017-05-12 MED ORDER — PRAVASTATIN SODIUM 20 MG PO TABS
20.0000 mg | ORAL_TABLET | Freq: Every day | ORAL | Status: DC
  Administered 2017-05-12 – 2017-05-13 (×2): 20 mg via ORAL
  Filled 2017-05-12 (×2): qty 1

## 2017-05-12 MED ORDER — ACETAMINOPHEN 325 MG PO TABS: 650.0000 mg | ORAL_TABLET | Freq: Four times a day (QID) | ORAL | Status: DC | PRN

## 2017-05-12 MED ORDER — ENALAPRIL MALEATE 10 MG PO TABS
20.0000 mg | ORAL_TABLET | Freq: Two times a day (BID) | ORAL | Status: DC
  Administered 2017-05-12 – 2017-05-14 (×4): 20 mg via ORAL
  Filled 2017-05-12 (×4): qty 2

## 2017-05-12 MED ORDER — INSULIN ASPART 100 UNIT/ML ~~LOC~~ SOLN: 0.0000 [IU] | Freq: Every day | SUBCUTANEOUS | Status: DC

## 2017-05-12 MED ORDER — ACETAMINOPHEN 650 MG RE SUPP: 650.0000 mg | Freq: Four times a day (QID) | RECTAL | Status: DC | PRN

## 2017-05-12 MED ORDER — SODIUM CHLORIDE 0.9% FLUSH
3.0000 mL | Freq: Two times a day (BID) | INTRAVENOUS | Status: DC
  Administered 2017-05-12 – 2017-05-14 (×3): 3 mL via INTRAVENOUS

## 2017-05-12 MED ORDER — MAGNESIUM OXIDE 400 (241.3 MG) MG PO TABS
400.0000 mg | ORAL_TABLET | Freq: Every evening | ORAL | Status: DC
  Administered 2017-05-12 – 2017-05-13 (×2): 400 mg via ORAL
  Filled 2017-05-12 (×2): qty 1

## 2017-05-12 MED ORDER — INSULIN GLARGINE 100 UNIT/ML ~~LOC~~ SOLN
26.0000 [IU] | Freq: Two times a day (BID) | SUBCUTANEOUS | Status: DC
  Administered 2017-05-12 – 2017-05-14 (×4): 26 [IU] via SUBCUTANEOUS
  Filled 2017-05-12 (×5): qty 0.26

## 2017-05-12 NOTE — ED Notes (Signed)
Pt states feels dizzy and hypoglycemic. BS checked.

## 2017-05-12 NOTE — ED Triage Notes (Signed)
Pt presents via EMS c/o sudden onset of dizziness. Denies headache or chest pain. Pt A&O x4 upon arrival. Manson PasseyBrown MD at bedside.

## 2017-05-12 NOTE — ED Notes (Signed)
Lab called for add ons.

## 2017-05-12 NOTE — Plan of Care (Signed)
  Activity: Risk for activity intolerance will decrease 05/12/2017 2253 - Not Progressing by Kalman JewelsBallentine, Tonee Silverstein, RN   Pain Managment: General experience of comfort will improve 05/12/2017 2253 - Not Progressing by Kalman JewelsBallentine, Richel Millspaugh, RN  Complaining of dizziness with minimal exertion.

## 2017-05-12 NOTE — Consult Note (Signed)
Melinda Rodriguez is a 71 y.o. female  [id number]  Primary Cardiologist: Adrian Blackwater Reason for Consultation: Symptomatic bradycardia  HPI: This is a 71 year old white female with history of atrial fibrillation and congestive heart failure presented to the hospital with dizziness and blurring of vision and feeling like she is going to pass out.   Review of Systems: No chest pain orthopnea PND or leg swelling   Past Medical History:  Diagnosis Date  . A-fib (HCC)   . Diabetes mellitus without complication (HCC)   . Dyspnea    DOE  . Dysrhythmia    AFIB  . Edema   . Hypertension   . Hypothyroidism   . Palpitations     Medications Prior to Admission  Medication Sig Dispense Refill  . apixaban (ELIQUIS) 5 MG TABS tablet Take 5 mg by mouth 2 (two) times daily.    . carvedilol (COREG) 12.5 MG tablet Take 12.5 mg by mouth 2 (two) times daily.    . Cholecalciferol (VITAMIN D3) 2000 units capsule Take 2,000 Units by mouth daily.    Marland Kitchen diltiazem (TIAZAC) 180 MG 24 hr capsule Take 180 mg by mouth daily.    . enalapril (VASOTEC) 20 MG tablet Take 20 mg by mouth 2 (two) times daily.    . hydrochlorothiazide (HYDRODIURIL) 25 MG tablet Take 25 mg by mouth daily.    . Insulin Glargine (LANTUS SOLOSTAR) 100 UNIT/ML Solostar Pen Inject 26 Units into the skin 2 (two) times daily.     . insulin lispro (HUMALOG) 100 UNIT/ML cartridge Inject 12-18 Units into the skin 3 (three) times daily with meals.    Marland Kitchen levothyroxine (SYNTHROID, LEVOTHROID) 75 MCG tablet Take 75 mcg by mouth daily before breakfast.    . magnesium oxide (MAG-OX) 400 MG tablet Take 400 mg by mouth every evening.    . metFORMIN (GLUCOPHAGE-XR) 500 MG 24 hr tablet Take 500 mg by mouth 2 (two) times daily.    . pravastatin (PRAVACHOL) 20 MG tablet Take 20 mg by mouth at bedtime.       Marland Kitchen apixaban  5 mg Oral BID  . cholecalciferol  2,000 Units Oral Daily  . enalapril  20 mg Oral BID  . insulin aspart  0-5 Units Subcutaneous QHS   . insulin aspart  0-9 Units Subcutaneous TID WC  . insulin glargine  26 Units Subcutaneous BID  . [START ON 05/13/2017] levothyroxine  75 mcg Oral QAC breakfast  . magnesium oxide  400 mg Oral QPM  . [START ON 05/13/2017] metFORMIN  500 mg Oral BID WC  . pravastatin  20 mg Oral QHS    Infusions: . sodium chloride 75 mL/hr at 05/12/17 1639    No Known Allergies  Social History   Socioeconomic History  . Marital status: Divorced    Spouse name: Not on file  . Number of children: Not on file  . Years of education: Not on file  . Highest education level: Not on file  Social Needs  . Financial resource strain: Not on file  . Food insecurity - worry: Not on file  . Food insecurity - inability: Not on file  . Transportation needs - medical: Not on file  . Transportation needs - non-medical: Not on file  Occupational History  . Not on file  Tobacco Use  . Smoking status: Never Smoker  . Smokeless tobacco: Never Used  Substance and Sexual Activity  . Alcohol use: No  . Drug use: No  . Sexual  activity: Not on file  Other Topics Concern  . Not on file  Social History Narrative   Independent at baseline    Family History  Problem Relation Age of Onset  . CVA Mother     PHYSICAL EXAM: Vitals:   05/12/17 1430 05/12/17 1551  BP: (!) 145/101 (!) 155/75  Pulse: (!) 45 67  Resp: (!) 22 18  Temp:  98.1 F (36.7 C)  SpO2: 99% 97%    No intake or output data in the 24 hours ending 05/12/17 1753  General:  Well appearing. No respiratory difficulty HEENT: normal Neck: supple. no JVD. Carotids 2+ bilat; no bruits. No lymphadenopathy or thryomegaly appreciated. Cor: PMI nondisplaced. Regular rate & rhythm. No rubs, gallops or murmurs. Lungs: clear Abdomen: soft, nontender, nondistended. No hepatosplenomegaly. No bruits or masses. Good bowel sounds. Extremities: no cyanosis, clubbing, rash, edema Neuro: alert & oriented x 3, cranial nerves grossly intact. moves all 4  extremities w/o difficulty. Affect pleasant.  ECG: No EKG in the chart but the monitor shows A. fib about 90 bpm  Results for orders placed or performed during the hospital encounter of 05/12/17 (from the past 24 hour(s))  CBC     Status: None   Collection Time: 05/12/17 11:12 AM  Result Value Ref Range   WBC 9.5 3.6 - 11.0 K/uL   RBC 4.33 3.80 - 5.20 MIL/uL   Hemoglobin 13.8 12.0 - 16.0 g/dL   HCT 16.140.2 09.635.0 - 04.547.0 %   MCV 93.0 80.0 - 100.0 fL   MCH 31.8 26.0 - 34.0 pg   MCHC 34.2 32.0 - 36.0 g/dL   RDW 40.913.3 81.111.5 - 91.414.5 %   Platelets 326 150 - 440 K/uL  Magnesium     Status: Abnormal   Collection Time: 05/12/17 11:12 AM  Result Value Ref Range   Magnesium 1.4 (L) 1.7 - 2.4 mg/dL  TSH     Status: None   Collection Time: 05/12/17 11:12 AM  Result Value Ref Range   TSH 1.061 0.350 - 4.500 uIU/mL  Basic metabolic panel     Status: Abnormal   Collection Time: 05/12/17 11:16 AM  Result Value Ref Range   Sodium 139 135 - 145 mmol/L   Potassium 4.0 3.5 - 5.1 mmol/L   Chloride 106 101 - 111 mmol/L   CO2 22 22 - 32 mmol/L   Glucose, Bld 157 (H) 65 - 99 mg/dL   BUN 27 (H) 6 - 20 mg/dL   Creatinine, Ser 7.821.29 (H) 0.44 - 1.00 mg/dL   Calcium 8.6 (L) 8.9 - 10.3 mg/dL   GFR calc non Af Amer 40 (L) >60 mL/min   GFR calc Af Amer 47 (L) >60 mL/min   Anion gap 11 5 - 15  Troponin I     Status: None   Collection Time: 05/12/17 11:16 AM  Result Value Ref Range   Troponin I <0.03 <0.03 ng/mL  Urinalysis, Complete w Microscopic     Status: Abnormal   Collection Time: 05/12/17 11:16 AM  Result Value Ref Range   Color, Urine YELLOW (A) YELLOW   APPearance HAZY (A) CLEAR   Specific Gravity, Urine 1.011 1.005 - 1.030   pH 5.0 5.0 - 8.0   Glucose, UA NEGATIVE NEGATIVE mg/dL   Hgb urine dipstick NEGATIVE NEGATIVE   Bilirubin Urine NEGATIVE NEGATIVE   Ketones, ur NEGATIVE NEGATIVE mg/dL   Protein, ur NEGATIVE NEGATIVE mg/dL   Nitrite NEGATIVE NEGATIVE   Leukocytes, UA NEGATIVE NEGATIVE  RBC / HPF 0-5 0 - 5 RBC/hpf   WBC, UA 0-5 0 - 5 WBC/hpf   Bacteria, UA NONE SEEN NONE SEEN   Squamous Epithelial / LPF 0-5 (A) NONE SEEN   Mucus PRESENT   Glucose, capillary     Status: Abnormal   Collection Time: 05/12/17 11:51 AM  Result Value Ref Range   Glucose-Capillary 154 (H) 65 - 99 mg/dL  Troponin I     Status: None   Collection Time: 05/12/17  4:11 PM  Result Value Ref Range   Troponin I <0.03 <0.03 ng/mL  Glucose, capillary     Status: Abnormal   Collection Time: 05/12/17  4:29 PM  Result Value Ref Range   Glucose-Capillary 198 (H) 65 - 99 mg/dL   Ct Head Wo Contrast  Result Date: 05/12/2017 CLINICAL DATA:  71 year old female with new onset dizziness. No known injury. EXAM: CT HEAD WITHOUT CONTRAST TECHNIQUE: Contiguous axial images were obtained from the base of the skull through the vertex without intravenous contrast. COMPARISON:  None. FINDINGS: Brain: Cerebral volume is within normal limits for age. No midline shift, ventriculomegaly, mass effect, evidence of mass lesion, intracranial hemorrhage or evidence of cortically based acute infarction. Gray-white matter differentiation is within normal limits throughout the brain. 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 Vascular: Calcified atherosclerosis at the skull base. No suspicious intracranial vascular hyperdensity. Skull: Intact.  No acute osseous abnormality identified. Sinuses/Orbits: Partially visible fluid level versus mucosal thickening in the left maxillary sinus. Mild left anterior ethmoid mucosal thickening. Otherwise clear. Bilateral tympanic cavities are clear. Other: No acute orbit or scalp soft tissue findings. IMPRESSION: 1.  Normal for age non contrast CT appearance of the brain. 2. Mild inflammatory changes in the left maxillary and ethmoid sinuses. Electronically Signed   By: Odessa FlemingH  Hall M.D.   On: 05/12/2017 12:10   Dg Chest Port 1 View  Result Date:  05/12/2017 CLINICAL DATA:  Dizziness.  Atrial fibrillation. EXAM: PORTABLE CHEST 1 VIEW COMPARISON:  None. FINDINGS: The heart size and mediastinal contours are within normal limits. Both lungs are clear. The visualized skeletal structures are unremarkable. IMPRESSION: No active disease. Electronically Signed   By: Signa Kellaylor  Stroud M.D.   On: 05/12/2017 12:12     ASSESSMENT AND PLAN: History of atrial fibrillation with dizziness and reported to have slow ventricular response rate in the emergency room leading to admission to the hospital. However patient has been on Cardizem 180 and 12.5 twice a day of carvedilol and we will hold that at this time. In the morning will start with carvedilol gradually and may have to convert the patient to sinus rhythm. The meantime we'll get EKG and echocardiogram on the patient.  Trinika Cortese A

## 2017-05-12 NOTE — H&P (Signed)
[facility] [place] [facility]   PATIENT NAME: Melinda Rodriguez    MR#:  [id number]  DATE OF BIRTH:  1945/06/16  DATE OF ADMISSION:  05/12/2017  PRIMARY CARE PHYSICIAN: Nell Range, Ruben Gottron, MD   REQUESTING/REFERRING PHYSICIAN: Dr. Bayard Males  CHIEF COMPLAINT:   Chief Complaint  Patient presents with  . Dizziness    HISTORY OF PRESENT ILLNESS:  Melinda Rodriguez  is a 71 y.o. female with a known history of atrial fibrillation on eliquis, diabetes mellitus, hypertension, hypothyroidism presents to hospital secondary to extreme dizziness that started this morning. Patient has been following with [facility] cardiology for her atrial fibrillation and heart heart rate is usually in the 90s. She has been steady with carvedilol and Cardizem orally. And has been taking eliquis for anticoagulation. She had in the past refused cardioversion and has been doing well with the rate controlled with her oral medications. No recent changes were done to her medications. 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 Was exhausted after she came home and slept until midnight. She got up and ate something, denies any dizziness at the time. This morning woke up around 8:30 AM and felt extremely dizzy to get even out of bed. Denies any nausea, vomiting or diaphoresis or chest pain. No fevers or chills. No ear ache or headache. She was noted to be bradycardic with heart rate in the 30s, slow ventricular response when she arrived. She did take her carvedilol this morning and she felt dizzy because she thought her blood pressure was high. Urine analysis is pending. Labs show little dehydration. Being admitted for symptomatic bradycardia.  PAST MEDICAL HISTORY:   Past Medical History:  Diagnosis Date  . A-fib (HCC)   . Diabetes mellitus without complication (HCC)   . Dyspnea    DOE  . Dysrhythmia    AFIB  . Edema   . Hypertension   . Hypothyroidism   . Palpitations       PAST SURGICAL HISTORY:   Past Surgical History:  Procedure Laterality Date  . ABDOMINAL HYSTERECTOMY    . CATARACT EXTRACTION W/PHACO Left 07/18/2016   Procedure: CATARACT EXTRACTION PHACO AND INTRAOCULAR LENS PLACEMENT (IOC);  Surgeon: Sallee Lange, MD;  Location: [facility] ORS;  Service: Ophthalmology;  Laterality: Left;  [place] 01:36 AP% 22.9 CDE 40.37 Fluid pack lot # [id number]  . CATARACT EXTRACTION W/PHACO Right 12/12/2016   Procedure: CATARACT EXTRACTION PHACO AND INTRAOCULAR LENS PLACEMENT (IOC);  Surgeon: Sallee Lange, MD;  Location: [facility] ORS;  Service: Ophthalmology;  Laterality: Right;  Lot# [id number] [place]: 01:20.6 AP%: 21.0 CDE: 35.09  . hamstring surgery    . TONSILLECTOMY      SOCIAL HISTORY:   Social History   Tobacco Use  . Smoking status: Never Smoker  . Smokeless tobacco: Never Used  Substance Use Topics  . Alcohol use: No    FAMILY HISTORY:   Family History  Problem Relation Age of Onset  . CVA Mother     DRUG ALLERGIES:  No Known Allergies  REVIEW OF SYSTEMS:   Review of Systems  Constitutional: Negative for chills, fever, malaise/fatigue and weight loss.  HENT: Negative for ear discharge, ear pain, hearing loss, nosebleeds and tinnitus.   Eyes: Negative for blurred vision, double vision and photophobia.  Respiratory: Negative for cough, hemoptysis, shortness of breath and wheezing.   Cardiovascular: Negative for chest pain, palpitations, orthopnea and leg swelling.  Gastrointestinal: Negative for abdominal pain, constipation, diarrhea, heartburn, melena, nausea and  vomiting.  Genitourinary: Negative for dysuria, frequency, hematuria and urgency.  Musculoskeletal: Negative for back pain, myalgias and neck pain.  Skin: Negative for rash.  Neurological: Positive for dizziness and weakness. Negative for tingling, tremors, sensory change, speech change, focal weakness and headaches.  Endo/Heme/Allergies: Does not bruise/bleed easily.   Psychiatric/Behavioral: Negative for depression.    MEDICATIONS AT HOME:   Prior to Admission medications   Medication Sig Start Date End Date Taking? Authorizing Provider  apixaban (ELIQUIS) 5 MG TABS tablet Take 5 mg by mouth 2 (two) times daily.   Yes [provider]  carvedilol (COREG) 12.5 MG tablet Take 12.5 mg by mouth 2 (two) times daily.   Yes [provider]  Cholecalciferol (VITAMIN D3) 2000 units capsule Take 2,000 Units by mouth daily.   Yes [provider]  diltiazem (TIAZAC) 180 MG 24 hr capsule Take 180 mg by mouth daily.   Yes [provider]  enalapril (VASOTEC) 20 MG tablet Take 20 mg by mouth 2 (two) times daily.   Yes [provider]  hydrochlorothiazide (HYDRODIURIL) 25 MG tablet Take 25 mg by mouth daily.   Yes [provider]  Insulin Glargine (LANTUS SOLOSTAR) 100 UNIT/ML Solostar Pen Inject 26 Units into the skin 2 (two) times daily.    Yes [provider]  insulin lispro (HUMALOG) 100 UNIT/ML cartridge Inject 12-18 Units into the skin 3 (three) times daily with meals.   Yes [provider]  levothyroxine (SYNTHROID, LEVOTHROID) 75 MCG tablet Take 75 mcg by mouth daily before breakfast.   Yes [provider]  magnesium oxide (MAG-OX) 400 MG tablet Take 400 mg by mouth every evening.   Yes [provider]  metFORMIN (GLUCOPHAGE-XR) 500 MG 24 hr tablet Take 500 mg by mouth 2 (two) times daily.   Yes [provider]  pravastatin (PRAVACHOL) 20 MG tablet Take 20 mg by mouth at bedtime.   Yes [provider]      VITAL SIGNS:  Blood pressure (!) 138/58, pulse (!) 50, temperature 98 F (36.7 C), temperature source Oral, resp. rate 15, height 5\' 4"  (1.626 m), weight 106.6 kg (235 lb), SpO2 98 %.  PHYSICAL EXAMINATION:   Physical Exam  GENERAL:  71 y.o.-year-old patient lying in the bed with no acute distress.  EYES: Pupils equal, round, reactive to light and  accommodation. No scleral icterus. Extraocular muscles intact.  HEENT: Head atraumatic, normocephalic. Oropharynx and nasopharynx clear.  NECK:  Supple, no jugular venous distention. No thyroid enlargement, no tenderness.  LUNGS: Normal breath sounds bilaterally, no wheezing, rales,rhonchi or crepitation. No use of accessory muscles of respiration.  CARDIOVASCULAR: S1, S2 irregular but normal rate. No murmurs, rubs, or gallops.  ABDOMEN: Soft, nontender, nondistended. Bowel sounds present. No organomegaly or mass.  EXTREMITIES: No pedal edema, cyanosis, or clubbing.  NEUROLOGIC: Cranial nerves II through XII are intact. Muscle strength 5/5 in all extremities. Sensation intact. Gait not checked.  PSYCHIATRIC: The patient is alert and oriented x 3.  SKIN: No obvious rash, lesion, or ulcer.   LABORATORY PANEL:   CBC Recent Labs  Lab 05/12/17 1112  WBC 9.5  HGB 13.8  HCT 40.2  PLT 326   ------------------------------------------------------------------------------------------------------------------  Chemistries  Recent Labs  Lab 05/12/17 1112 05/12/17 1116  NA  --  139  K  --  4.0  CL  --  106  CO2  --  22  GLUCOSE  --  157*  BUN  --  27*  CREATININE  --  1.29*  CALCIUM  --  8.6*  MG 1.4*  --    ------------------------------------------------------------------------------------------------------------------  Cardiac Enzymes Recent Labs  Lab 05/12/17 1116  TROPONINI <0.03   ------------------------------------------------------------------------------------------------------------------  RADIOLOGY:  Ct Head Wo Contrast  Result Date: 05/12/2017 CLINICAL DATA:  71 year old female with new onset dizziness. No known injury. EXAM: CT HEAD WITHOUT CONTRAST TECHNIQUE: Contiguous axial images were obtained from the base of the skull through the vertex without intravenous contrast. COMPARISON:  None. FINDINGS: Brain: Cerebral volume is within normal limits for age. No midline  shift, ventriculomegaly, mass effect, evidence of mass lesion, intracranial hemorrhage or evidence of cortically based acute infarction. Gray-white matter differentiation is within normal limits throughout the brain. 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 Vascular: Calcified atherosclerosis at the skull base. No suspicious intracranial vascular hyperdensity. Skull: Intact.  No acute osseous abnormality identified. Sinuses/Orbits: Partially visible fluid level versus mucosal thickening in the left maxillary sinus. Mild left anterior ethmoid mucosal thickening. Otherwise clear. Bilateral tympanic cavities are clear. Other: No acute orbit or scalp soft tissue findings. IMPRESSION: 1.  Normal for age non contrast CT appearance of the brain. 2. Mild inflammatory changes in the left maxillary and ethmoid sinuses. Electronically Signed   By: Odessa FlemingH  Hall M.D.   On: 05/12/2017 12:10   Dg Chest Port 1 View  Result Date: 05/12/2017 CLINICAL DATA:  Dizziness.  Atrial fibrillation. EXAM: PORTABLE CHEST 1 VIEW COMPARISON:  None. FINDINGS: The heart size and mediastinal contours are within normal limits. Both lungs are clear. The visualized skeletal structures are unremarkable. IMPRESSION: No active disease. Electronically Signed   By: Signa Kellaylor  Stroud M.D.   On: 05/12/2017 12:12    EKG:   Orders placed or performed during the hospital encounter of 05/12/17  . ED EKG  . ED EKG    IMPRESSION AND PLAN:   Melinda Rodriguez  is a 71 y.o. female with a known history of atrial fibrillation on eliquis, diabetes mellitus, hypertension, hypothyroidism presents to hospital secondary to extreme dizziness that started this morning.  1. Dizziness-likely symptomatic bradycardia. CT of the head did not show any acute findings. -Gentle hydration for dehydration. Hold Coreg and Cardizem. -Monitor on telemetry. Ambulate in a.m.  2. Atrial fibrillation with slow ventricular response-not sure  what triggered it. Potassium is within normal limits. -Borderline low magnesium, being replaced. -Cardiology consult. Monitor on telemetry. Recycle troponins. -Hold Coreg and Cardizem for now. -Continue eliquis for anticoagulation. -Check urinalysis to rule out infection.  3. Diabetes mellitus-on Lantus, metformin and added sliding scale insulin as well. Check A1c  4. Hypothyroidism-continue Synthroid. Check TSH  5. DVT prophylaxis-on eliquis already    All the records are reviewed and case discussed with ED provider. Management plans discussed with the patient, family and they are in agreement.  CODE STATUS: Full Code  TOTAL TIME TAKING CARE OF THIS PATIENT: 50 minutes.    Enid BaasKALISETTI,Leobardo Granlund M.D on 05/12/2017 at 2:15 PM  Between 7am to 6pm - Pager - [phone]  After 6pm go to [email or web address] - Social research officer, governmentpassword [email or web address]  [facility]  Office  [phone]  CC: Primary care physician; Nell Rangeooner, Ruben GottronEdward William, MD

## 2017-05-12 NOTE — ED Provider Notes (Signed)
[facility] Emergency Department Provider Note    First MD Initiated Contact with Patient 05/12/17 1100     (approximate)  I have reviewed the triage vital signs and the nursing notes.   HISTORY  Chief Complaint Dizziness  HPI Melinda Rodriguez is a 71 y.o. female with below list of chronic medical conditions including atrial fibrillation presents to the emergency department via EMS with acute onset of dizziness this morning.  Patient denies any pain no headache or chest pain.  Patient denies any dyspnea patient denies any weakness numbness or change in vision.  Patient denies any nausea or vomiting.   Past Medical History:  Diagnosis Date  . Diabetes mellitus without complication (HCC)   . Dyspnea    DOE  . Dysrhythmia    AFIB  . Edema   . Hypertension   . Hypothyroidism   . Palpitations     There are no active problems to display for this patient.   Past Surgical History:  Procedure Laterality Date  . ABDOMINAL HYSTERECTOMY    . CATARACT EXTRACTION W/PHACO Left 07/18/2016   Procedure: CATARACT EXTRACTION PHACO AND INTRAOCULAR LENS PLACEMENT (IOC);  Surgeon: Sallee Lange, MD;  Location: [facility] ORS;  Service: Ophthalmology;  Laterality: Left;  [place] 01:36 AP% 22.9 CDE 40.37 Fluid pack lot # [id number]  . CATARACT EXTRACTION W/PHACO Right 12/12/2016   Procedure: CATARACT EXTRACTION PHACO AND INTRAOCULAR LENS PLACEMENT (IOC);  Surgeon: Sallee Lange, MD;  Location: [facility] ORS;  Service: Ophthalmology;  Laterality: Right;  Lot# [id number] H [place]: 01:20.6 AP%: 21.0 CDE: 35.09  . TONSILLECTOMY      Prior to Admission medications   Medication Sig Start Date End Date Taking? Authorizing Provider  apixaban (ELIQUIS) 5 MG TABS tablet Take 5 mg by mouth 2 (two) times daily.   Yes [provider]  carvedilol (COREG) 12.5 MG tablet Take 12.5 mg by mouth 2 (two) times daily.   Yes [provider]  Cholecalciferol (VITAMIN D3) 2000 units  capsule Take 2,000 Units by mouth daily.   Yes [provider]  diltiazem (TIAZAC) 180 MG 24 hr capsule Take 180 mg by mouth daily.   Yes [provider]  enalapril (VASOTEC) 20 MG tablet Take 20 mg by mouth 2 (two) times daily.   Yes [provider]  hydrochlorothiazide (HYDRODIURIL) 25 MG tablet Take 25 mg by mouth daily.   Yes [provider]  Insulin Glargine (LANTUS SOLOSTAR) 100 UNIT/ML Solostar Pen Inject 26 Units into the skin 2 (two) times daily.    Yes [provider]  insulin lispro (HUMALOG) 100 UNIT/ML cartridge Inject 12-18 Units into the skin 3 (three) times daily with meals.   Yes [provider]  levothyroxine (SYNTHROID, LEVOTHROID) 75 MCG tablet Take 75 mcg by mouth daily before breakfast.   Yes [provider]  magnesium oxide (MAG-OX) 400 MG tablet Take 400 mg by mouth every evening.   Yes [provider]  metFORMIN (GLUCOPHAGE-XR) 500 MG 24 hr tablet Take 500 mg by mouth 2 (two) times daily.   Yes [provider]  pravastatin (PRAVACHOL) 20 MG tablet Take 20 mg by mouth at bedtime.   Yes [provider]    Allergies No known drug allergies History reviewed. No pertinent family history.  Social History Social History   Tobacco Use  . Smoking status: Never Smoker  . Smokeless tobacco: Never Used  Substance Use Topics  . Alcohol use: No  . Drug use: No  Review of Systems Constitutional: No fever/chills Eyes: No visual changes. ENT: No sore throat. Cardiovascular: Denies chest pain. Respiratory: Denies shortness of breath. Gastrointestinal: No abdominal pain.  No nausea, no vomiting.  No diarrhea.  No constipation. Genitourinary: Negative for dysuria. Musculoskeletal: Negative for neck pain.  Negative for back pain. Integumentary: Negative for rash. Neurological: Negative for headaches, focal weakness or numbness.  Positive for  dizziness  ____________________________________________   PHYSICAL EXAM:  VITAL SIGNS: ED Triage Vitals  Enc Vitals Group     BP 05/12/17 1109 (!) 123/105     Pulse Rate 05/12/17 1109 (!) 59     Resp 05/12/17 1109 14     Temp 05/12/17 1109 98 F (36.7 C)     Temp Source 05/12/17 1109 Oral     SpO2 05/12/17 1109 98 %     Weight 05/12/17 1113 106.6 kg (235 lb)     Height 05/12/17 1113 1.626 m (5\' 4" )     Head Circumference --      Peak Flow --      Pain Score --      Pain Loc --      Pain Edu? --      Excl. in GC? --     Constitutional: Alert and oriented. Well appearing and in no acute distress. Eyes: Conjunctivae are normal. PERRL. EOMI. Head: Atraumatic. Mouth/Throat: Mucous membranes are moist.  Oropharynx non-erythematous. Neck: No stridor.   Cardiovascular: Bradycardia, irregular rhythm. Good peripheral circulation. Grossly normal heart sounds. Respiratory: Normal respiratory effort.  No retractions. Lungs CTAB. Gastrointestinal: Soft and nontender. No distention.   Musculoskeletal: No lower extremity tenderness nor edema. No gross deformities of extremities. Neurologic:  Normal speech and language. No gross focal neurologic deficits are appreciated.  Skin:  Skin is warm, dry and intact. No rash noted. Psychiatric: Mood and affect are normal. Speech and behavior are normal.  ____________________________________________   LABS (all labs ordered are listed, but only abnormal results are displayed)  Labs Reviewed  BASIC METABOLIC PANEL - Abnormal; Notable for the following components:      Result Value   Glucose, Bld 157 (*)    BUN 27 (*)    Creatinine, Ser 1.29 (*)    Calcium 8.6 (*)    GFR calc non Af Amer 40 (*)    GFR calc Af Amer 47 (*)    All other components within normal limits  GLUCOSE, CAPILLARY - Abnormal; Notable for the following components:   Glucose-Capillary 154 (*)    All other components within normal limits  CBC  TROPONIN I  MAGNESIUM   TSH   ____________________________________________  EKG  ED ECG REPORT I, [place] N Rodarius Kichline, the attending physician, personally viewed and interpreted this ECG.   Date: 05/12/2017  EKG Time: 11:10 AM  Rate: 39  Rhythm: Bradycardia, atrial fibrillation  Axis: Normal  Intervals: Normal  ST&T Change: None  ____________________________________________  RADIOLOGY I, [place] N Sagan Maselli, personally viewed and evaluated these images (plain radiographs) as part of my medical decision making, as well as reviewing the written report by the radiologist.  Ct Head Wo Contrast  Result Date: 05/12/2017 CLINICAL DATA:  71 year old female with new onset dizziness. No known injury. EXAM: CT HEAD WITHOUT CONTRAST TECHNIQUE: Contiguous axial images were obtained from the base of the skull through the vertex without intravenous contrast. COMPARISON:  None. FINDINGS: Brain: Cerebral volume is within normal limits for age. No midline shift, ventriculomegaly, mass effect, evidence of mass lesion, intracranial hemorrhage or evidence  of cortically based acute infarction. Gray-white matter differentiation is within normal limits throughout the brain. 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 Vascular: Calcified atherosclerosis at the skull base. No suspicious intracranial vascular hyperdensity. Skull: Intact.  No acute osseous abnormality identified. Sinuses/Orbits: Partially visible fluid level versus mucosal thickening in the left maxillary sinus. Mild left anterior ethmoid mucosal thickening. Otherwise clear. Bilateral tympanic cavities are clear. Other: No acute orbit or scalp soft tissue findings. IMPRESSION: 1.  Normal for age non contrast CT appearance of the brain. 2. Mild inflammatory changes in the left maxillary and ethmoid sinuses. Electronically Signed   By: Odessa FlemingH  Hall M.D.   On: 05/12/2017 12:10   Dg Chest Port 1 View  Result Date: 05/12/2017 CLINICAL DATA:   Dizziness.  Atrial fibrillation. EXAM: PORTABLE CHEST 1 VIEW COMPARISON:  None. FINDINGS: The heart size and mediastinal contours are within normal limits. Both lungs are clear. The visualized skeletal structures are unremarkable. IMPRESSION: No active disease. Electronically Signed   By: Signa Kellaylor  Stroud M.D.   On: 05/12/2017 12:12    _____________  Procedures   ____________________________________________   INITIAL IMPRESSION / ASSESSMENT AND PLAN / ED COURSE  As part of my medical decision making, I reviewed the following data within the electronic MEDICAL RECORD [id number] year old female presented with above-stated history and physical exam dizziness.  Suspect patient's dizziness to be secondary to intermittent bradycardia with heart rate going as low as 39.  CT scan of the head was performed which revealed no acute intracranial abnormality laboratory data unremarkable with the exception of a elevated BUN of 27 creatinine 1.29.  Patient discussed with Dr. Burt KnackYankey cardiology [facility].  Was notified by Dr. Allayne StackYankee and [facility] that [facility] were currently on diversion and as such patient admitted here to Dr. Nemiah CommanderKalisetti [facility] ______________________________  FINAL CLINICAL IMPRESSION(S) / ED DIAGNOSES  Final diagnoses:  Symptomatic bradycardia  Atrial fibrillation, unspecified type (HCC)     MEDICATIONS GIVEN DURING THIS VISIT:  Medications - No data to display   ED Discharge Orders    None       Note:  This document was prepared using Dragon voice recognition software and may include unintentional dictation errors.      Darci CurrentBrown, [place] N, MD 05/12/17 [phone]

## 2017-05-13 ENCOUNTER — Inpatient Hospital Stay
Admit: 2017-05-13 | Discharge: 2017-05-13 | Disposition: A | Discharge status: Home / Self Care | Payer: Medicare Other | Attending: Cardiovascular Disease | Admitting: Cardiovascular Disease

## 2017-05-13 LAB — BASIC METABOLIC PANEL
Anion gap: 8 (ref 5–15)
BUN: 19 mg/dL (ref 6–20)
CALCIUM: 8.9 mg/dL (ref 8.9–10.3)
CHLORIDE: 106 mmol/L (ref 101–111)
CO2: 27 mmol/L (ref 22–32)
CREATININE: 1.18 mg/dL — AB (ref 0.44–1.00)
GFR, EST AFRICAN AMERICAN: 52 mL/min — AB (ref >60)
GFR, EST NON AFRICAN AMERICAN: 45 mL/min — AB (ref >60)
Glucose, Bld: 115 mg/dL — ABNORMAL HIGH (ref 65–99)
Potassium: 4 mmol/L (ref 3.5–5.1)
SODIUM: 141 mmol/L (ref 135–145)

## 2017-05-13 LAB — GLUCOSE, CAPILLARY
GLUCOSE-CAPILLARY: 107 mg/dL — AB (ref 65–99)
GLUCOSE-CAPILLARY: 193 mg/dL — AB (ref 65–99)
GLUCOSE-CAPILLARY: 157 mg/dL — AB (ref 65–99)
Glucose-Capillary: 115 mg/dL — ABNORMAL HIGH (ref 65–99)

## 2017-05-13 LAB — CBC
HCT: 40.1 % (ref 35.0–47.0)
Hemoglobin: 13.8 g/dL (ref 12.0–16.0)
MCH: 32 pg (ref 26.0–34.0)
MCHC: 34.4 g/dL (ref 32.0–36.0)
MCV: 93.2 fL (ref 80.0–100.0)
PLATELETS: 305 10*3/uL (ref 150–440)
RBC: 4.31 MIL/uL (ref 3.80–5.20)
RDW: 13.5 % (ref 11.5–14.5)
WBC: 8.8 10*3/uL (ref 3.6–11.0)

## 2017-05-13 LAB — ECHOCARDIOGRAM COMPLETE
HEIGHTINCHES: 64 in
Weight: 3760 oz

## 2017-05-13 LAB — MAGNESIUM: MAGNESIUM: 1.9 mg/dL (ref 1.7–2.4)

## 2017-05-13 LAB — TROPONIN I

## 2017-05-13 MED ORDER — MECLIZINE HCL 25 MG PO TABS
25.0000 mg | ORAL_TABLET | Freq: Three times a day (TID) | ORAL | Status: DC | PRN
  Administered 2017-05-13 – 2017-05-14 (×3): 25 mg via ORAL
  Filled 2017-05-13 (×4): qty 1

## 2017-05-13 NOTE — Discharge Summary (Addendum)
[facility] [place] [facility]   PATIENT NAME: Melinda Rodriguez    MR#:  [id number]  DATE OF BIRTH:  [birth year]  DATE OF ADMISSION:  05/12/2017   ADMITTING PHYSICIAN: Enid Baasadhika Kalisetti, MD  DATE OF DISCHARGE:  05/13/2017 PRIMARY CARE PHYSICIAN: Cooner, Ruben GottronEdward William, MD   ADMISSION DIAGNOSIS:  dizziness DISCHARGE DIAGNOSIS:  Active Problems:   Symptomatic bradycardia  SECONDARY DIAGNOSIS:   Past Medical History:  Diagnosis Date  . A-fib (HCC)   . Diabetes mellitus without complication (HCC)   . Dyspnea    DOE  . Dysrhythmia    AFIB  . Edema   . Hypertension   . Hypothyroidism   . Palpitations    HOSPITAL COURSE:   Melinda Rodriguez  is a 71 y.o. female with a known history of atrial fibrillation on eliquis, diabetes mellitus, hypertension, hypothyroidism presents to hospital secondary to extreme dizziness that started this morning.  1. Dizziness-likely symptomatic bradycardia. CT of the head did not show any acute findings. -Gentle hydration for dehydration. Hold Coreg and Cardizem. Per Dr. Welton FlakesKhan, No longer has bradycardia and labs have improved and may go home with f/u 1 week in office. Add prn Antivert. But the patient still feels dizzy, [facility]  It is said she was accepted and waiting for bed offer.  2. Atrial fibrillation with slow ventricular response. Potassium is within normal limits. -Hold Coreg and Cardizem for now. -Continue eliquis for anticoagulation.  3. Diabetes mellitus-on Lantus, metformin and added sliding scale insulin as well. Check A1c  4. Hypothyroidism-continue Synthroid.  Hypomagnesemia.  Improved with magnesium supplement.  DISCHARGE CONDITIONS:  [facility] CONSULTS OBTAINED:  Treatment Team:  Laurier NancyKhan, Shaukat A, MD DRUG ALLERGIES:  No Known Allergies DISCHARGE MEDICATIONS:   Allergies as of 05/13/2017   No Known Allergies     Medication List    STOP taking these  medications   carvedilol 12.5 MG tablet Commonly known as:  COREG   diltiazem 180 MG 24 hr capsule Commonly known as:  TIAZAC     TAKE these medications   ELIQUIS 5 MG Tabs tablet Generic drug:  apixaban Take 5 mg by mouth 2 (two) times daily.   enalapril 20 MG tablet Commonly known as:  VASOTEC Take 20 mg by mouth 2 (two) times daily.   HUMALOG 100 UNIT/ML cartridge Generic drug:  insulin lispro Inject 12-18 Units into the skin 3 (three) times daily with meals.   hydrochlorothiazide 25 MG tablet Commonly known as:  HYDRODIURIL Take 25 mg by mouth daily.   LANTUS SOLOSTAR 100 UNIT/ML Solostar Pen Generic drug:  Insulin Glargine Inject 26 Units into the skin 2 (two) times daily.   levothyroxine 75 MCG tablet Commonly known as:  SYNTHROID, LEVOTHROID Take 75 mcg by mouth daily before breakfast.   magnesium oxide 400 MG tablet Commonly known as:  MAG-OX Take 400 mg by mouth every evening.   metFORMIN 500 MG 24 hr tablet Commonly known as:  GLUCOPHAGE-XR Take 500 mg by mouth 2 (two) times daily.   pravastatin 20 MG tablet Commonly known as:  PRAVACHOL Take 20 mg by mouth at bedtime.   Vitamin D3 2000 units capsule Take 2,000 Units by mouth daily.        DISCHARGE INSTRUCTIONS:  See AVS.  If you experience worsening of your admission symptoms, develop shortness of breath, life threatening emergency, suicidal or homicidal thoughts you must seek medical attention immediately by calling 911  or calling your MD immediately  if symptoms less severe.  You Must read complete instructions/literature along with all the possible adverse reactions/side effects for all the Medicines you take and that have been prescribed to you. Take any new Medicines after you have completely understood and accpet all the possible adverse reactions/side effects.   Please note  You were cared for by a hospitalist during your hospital stay. If you have any questions about your discharge  medications or the care you received while you were in the hospital after you are discharged, you can call the unit and asked to speak with the hospitalist on call if the hospitalist that took care of you is not available. Once you are discharged, your primary care physician will handle any further medical issues. Please note that NO REFILLS for any discharge medications will be authorized once you are discharged, as it is imperative that you return to your primary care physician (or establish a relationship with a primary care physician if you do not have one) for your aftercare needs so that they can reassess your need for medications and monitor your lab values.    On the day of Discharge:  VITAL SIGNS:  Blood pressure 130/67, pulse 61, temperature 97.9 F (36.6 C), temperature source Oral, resp. rate 18, height 5\' 4"  (1.626 m), weight 235 lb (106.6 kg), SpO2 97 %. PHYSICAL EXAMINATION:  GENERAL:  71 y.o.-year-old patient lying in the bed with no acute distress.  EYES: Pupils equal, round, reactive to light and accommodation. No scleral icterus. Extraocular muscles intact.  HEENT: Head atraumatic, normocephalic. Oropharynx and nasopharynx clear.  NECK:  Supple, no jugular venous distention. No thyroid enlargement, no tenderness.  LUNGS: Normal breath sounds bilaterally, no wheezing, rales,rhonchi or crepitation. No use of accessory muscles of respiration.  CARDIOVASCULAR: S1, S2 normal. No murmurs, rubs, or gallops.  ABDOMEN: Soft, non-tender, non-distended. Bowel sounds present. No organomegaly or mass.  EXTREMITIES: No pedal edema, cyanosis, or clubbing.  NEUROLOGIC: Cranial nerves II through XII are intact. Muscle strength 5/5 in all extremities. Sensation intact. Gait not checked.  PSYCHIATRIC: The patient is alert and oriented x 3.  SKIN: No obvious rash, lesion, or ulcer.  DATA REVIEW:   CBC Recent Labs  Lab 05/13/17 0342  WBC 8.8  HGB 13.8  HCT 40.1  PLT 305    Chemistries    Recent Labs  Lab 05/13/17 0342  NA 141  K 4.0  CL 106  CO2 27  GLUCOSE 115*  BUN 19  CREATININE 1.18*  CALCIUM 8.9  MG 1.9     Microbiology Results  No results found for this or any previous visit.  RADIOLOGY:  No results found.   Management plans discussed with the patient, family and they are in agreement.  CODE STATUS: Full Code   TOTAL TIME TAKING CARE OF THIS PATIENT: 36 minutes.    Shaune PollackQing Shirah Roseman M.D on 05/13/2017 at 3:42 PM  Between 7am to 6pm - Pager - [phone]  After 6pm go to [email or web address] - Social research officer, governmentpassword [email or web address] [facility]  [facility] [place] [facility]  Office  [phone]  CC: Primary care physician; Nell Rangeooner, Ruben GottronEdward William, MD   Note: This dictation was prepared with Dragon dictation along with smaller phrase technology. Any transcriptional errors that result from this process are unintentional.

## 2017-05-13 NOTE — Progress Notes (Signed)
SUBJECTIVE: Feeling much better   Vitals:   05/12/17 1934 05/12/17 2225 05/13/17 0338 05/13/17 0751  BP: (!) 160/82 (!) 144/67 138/80 130/67  Pulse: 69 (!) 53 79 61  Resp: 17  17 18   Temp: 98.4 F (36.9 C)  98 F (36.7 C) 97.9 F (36.6 C)  TempSrc: Oral   Oral  SpO2: 98%  96% 97%  Weight:      Height:        Intake/Output Summary (Last 24 hours) at 05/13/2017 1004 Last data filed at 05/13/2017 0947 Gross per 24 hour  Intake 240 ml  Output 750 ml  Net -510 ml    LABS: Basic Metabolic Panel: Recent Labs    05/12/17 1112 05/12/17 1116 05/13/17 0342  NA  --  139 141  K  --  4.0 4.0  CL  --  106 106  CO2  --  22 27  GLUCOSE  --  157* 115*  BUN  --  27* 19  CREATININE  --  1.29* 1.18*  CALCIUM  --  8.6* 8.9  MG 1.4*  --  1.9   Liver Function Tests: No results for input(s): AST, ALT, ALKPHOS, BILITOT, PROT, ALBUMIN in the last 72 hours. No results for input(s): LIPASE, AMYLASE in the last 72 hours. CBC: Recent Labs    05/12/17 1112 05/13/17 0342  WBC 9.5 8.8  HGB 13.8 13.8  HCT 40.2 40.1  MCV 93.0 93.2  PLT 326 305   Cardiac Enzymes: Recent Labs    05/12/17 1611 05/12/17 2128 05/13/17 0342  TROPONINI <0.03 <0.03 <0.03   BNP: Invalid input(s): POCBNP D-Dimer: No results for input(s): DDIMER in the last 72 hours. Hemoglobin A1C: No results for input(s): HGBA1C in the last 72 hours. Fasting Lipid Panel: No results for input(s): CHOL, HDL, LDLCALC, TRIG, CHOLHDL, LDLDIRECT in the last 72 hours. Thyroid Function Tests: Recent Labs    05/12/17 1112  TSH 1.061   Anemia Panel: No results for input(s): VITAMINB12, FOLATE, FERRITIN, TIBC, IRON, RETICCTPCT in the last 72 hours.   PHYSICAL EXAM General: Well developed, well nourished, in no acute distress HEENT:  Normocephalic and atramatic Neck:  No JVD.  Lungs: Clear bilaterally to auscultation and percussion. Heart: HRRR . Normal S1 and S2 without gallops or murmurs.  Abdomen: Bowel sounds are  positive, abdomen soft and non-tender  Msk:  Back normal, normal gait. Normal strength and tone for age. Extremities: No clubbing, cyanosis or edema.   Neuro: Alert and oriented X 3. Psych:  Good affect, responds appropriately  TELEMETRY: NSR  ASSESSMENT AND PLAN: Sinus rhthm 70-80/min. No longer has bradycardia and labs have improved and may go home with f/u 1 week in office. Active Problems:   Symptomatic bradycardia    Mohamadou Maciver A, MD, [facility] 05/13/2017 10:04 AM

## 2017-05-13 NOTE — Discharge Instructions (Signed)
Heart healthy and ADA diet. °

## 2017-05-13 NOTE — Progress Notes (Signed)
*  PRELIMINARY RESULTS* Echocardiogram 2D Echocardiogram has been performed.  Melinda Rodriguez, Melinda Rodriguez 05/13/2017, 2:11 PM

## 2017-05-13 NOTE — Plan of Care (Signed)
  Progressing Education: Knowledge of General Education information will improve 05/13/2017 1738 - Progressing by Rigoberto NoelMorales, Parilee Hally Y, RN Health Behavior/Discharge Planning: Ability to manage health-related needs will improve 05/13/2017 1738 - Progressing by Rigoberto NoelMorales, Creasie Lacosse Y, RN Clinical Measurements: Ability to maintain clinical measurements within normal limits will improve 05/13/2017 1738 - Progressing by Rigoberto NoelMorales, Ronee Ranganathan Y, RN Will remain free from infection 05/13/2017 1738 - Progressing by Rigoberto NoelMorales, Owin Vignola Y, RN

## 2017-05-14 LAB — GLUCOSE, CAPILLARY: Glucose-Capillary: 92 mg/dL (ref 65–99)

## 2017-05-14 MED ORDER — CARVEDILOL 12.5 MG PO TABS: 12.5000 mg | ORAL_TABLET | Freq: Two times a day (BID) | ORAL | Status: DC

## 2017-05-14 MED ORDER — MECLIZINE HCL 25 MG PO TABS: 25.0000 mg | ORAL_TABLET | Freq: Three times a day (TID) | ORAL | 0 refills | Status: AC | PRN

## 2017-05-14 NOTE — Care Management Important Message (Signed)
Important Message  Patient Details  Name: Glendora Scoreancy J Koplin MRN: [id number] Date of Birth: 01-01-1946   Medicare Important Message Given:  N/A - LOS <3 / Initial given by admissions    Chapman FitchBOWEN, Verdelle Valtierra T, RN 05/14/2017, 10:45 AM

## 2017-05-14 NOTE — Plan of Care (Signed)
  Progressing Education: Knowledge of General Education information will improve 05/14/2017 0322 - Progressing by Kerrie BuffaloBakare, Kiernan Farkas Muili, RN Health Behavior/Discharge Planning: Ability to manage health-related needs will improve 05/14/2017 0322 - Progressing by Kerrie BuffaloBakare, Delson Dulworth Muili, RN Clinical Measurements: Ability to maintain clinical measurements within normal limits will improve 05/14/2017 0322 - Progressing by Kerrie BuffaloBakare, Rhonna Holster Muili, RN Will remain free from infection 05/14/2017 0322 - Progressing by Kerrie BuffaloBakare, Asmaa Tirpak Muili, RN Diagnostic test results will improve 05/14/2017 0322 - Progressing by Kerrie BuffaloBakare, Jaianna Nicoll Muili, RN Respiratory complications will improve 05/14/2017 0322 - Progressing by Kerrie BuffaloBakare, Jayce Boyko Muili, RN Cardiovascular complication will be avoided 05/14/2017 0322 - Progressing by Kerrie BuffaloBakare, Florine Sprenkle Muili, RN Activity: Risk for activity intolerance will decrease 05/14/2017 0322 - Progressing by Kerrie BuffaloBakare, Xolani Degracia Muili, RN Nutrition: Adequate nutrition will be maintained 05/14/2017 0322 - Progressing by Kerrie BuffaloBakare, Garvin Ellena Muili, RN Coping: Level of anxiety will decrease 05/14/2017 0322 - Progressing by Kerrie BuffaloBakare, Jatoria Kneeland Muili, RN Elimination: Will not experience complications related to bowel motility 05/14/2017 0322 - Progressing by Kerrie BuffaloBakare, Whitleigh Garramone Muili, RN Will not experience complications related to urinary retention 05/14/2017 0322 - Progressing by Kerrie BuffaloBakare, Vernon Ariel Muili, RN Pain Managment: General experience of comfort will improve 05/14/2017 0322 - Progressing by Kerrie BuffaloBakare, Jakira Mcfadden Muili, RN Safety: Ability to remain free from injury will improve 05/14/2017 0322 - Progressing by Kerrie BuffaloBakare, Corissa Oguinn Muili, RN Skin Integrity: Risk for impaired skin integrity will decrease 05/14/2017 0322 - Progressing by Kerrie BuffaloBakare, Kameron Blethen Muili, RN

## 2017-05-14 NOTE — Progress Notes (Signed)
SUBJECTIVE: Patient is feeling much better   Vitals:   05/13/17 1656 05/13/17 2131 05/14/17 0458 05/14/17 0753  BP: (!) 150/81 (!) 153/84 125/78 140/74  Pulse: 94 (!) 109 71 (!) 59  Resp: 18 18 18 14   Temp:  98.2 F (36.8 C) 97.6 F (36.4 C) 98.1 F (36.7 C)  TempSrc:  Oral Oral Oral  SpO2: 95% 96% 98% 97%  Weight:      Height:        Intake/Output Summary (Last 24 hours) at 05/14/2017 0902 Last data filed at 05/14/2017 0801 Gross per 24 hour  Intake 720 ml  Output 2000 ml  Net -1280 ml    LABS: Basic Metabolic Panel: Recent Labs    05/12/17 1112 05/12/17 1116 05/13/17 0342  NA  --  139 141  K  --  4.0 4.0  CL  --  106 106  CO2  --  22 27  GLUCOSE  --  157* 115*  BUN  --  27* 19  CREATININE  --  1.29* 1.18*  CALCIUM  --  8.6* 8.9  MG 1.4*  --  1.9   Liver Function Tests: No results for input(s): AST, ALT, ALKPHOS, BILITOT, PROT, ALBUMIN in the last 72 hours. No results for input(s): LIPASE, AMYLASE in the last 72 hours. CBC: Recent Labs    05/12/17 1112 05/13/17 0342  WBC 9.5 8.8  HGB 13.8 13.8  HCT 40.2 40.1  MCV 93.0 93.2  PLT 326 305   Cardiac Enzymes: Recent Labs    05/12/17 1611 05/12/17 2128 05/13/17 0342  TROPONINI <0.03 <0.03 <0.03   BNP: Invalid input(s): POCBNP D-Dimer: No results for input(s): DDIMER in the last 72 hours. Hemoglobin A1C: No results for input(s): HGBA1C in the last 72 hours. Fasting Lipid Panel: No results for input(s): CHOL, HDL, LDLCALC, TRIG, CHOLHDL, LDLDIRECT in the last 72 hours. Thyroid Function Tests: Recent Labs    05/12/17 1112  TSH 1.061   Anemia Panel: No results for input(s): VITAMINB12, FOLATE, FERRITIN, TIBC, IRON, RETICCTPCT in the last 72 hours.   PHYSICAL EXAM General: Well developed, well nourished, in no acute distress HEENT:  Normocephalic and atramatic Neck:  No JVD.  Lungs: Clear bilaterally to auscultation and percussion. Heart: HRRR . Normal S1 and S2 without gallops or murmurs.   Abdomen: Bowel sounds are positive, abdomen soft and non-tender  Msk:  Back normal, normal gait. Normal strength and tone for age. Extremities: No clubbing, cyanosis or edema.   Neuro: Alert and oriented X 3. Psych:  Good affect, responds appropriately  TELEMETRY: Atrial fibrillation with controlled ventricular rate of 90/m  ASSESSMENT AND PLAN: Atypical relation with controlled ventricular rate admitted with slow ventricular rate and after stopping Cardizem and carvedilol the heart rate has significantly improved. Since she is going occasionally over 100 will go back and add carvedilol 12.5 twice a day and will not restart Cardizem. Patient can be discharged with follow-up in the office this Friday at 11 AM.  Active Problems:   Symptomatic bradycardia    Latice Waitman A, MD, [facility] 05/14/2017 9:02 AM

## 2017-05-14 NOTE — Progress Notes (Signed)
Glendora ScoreNancy J Netzer to be D/C'd Home per MD order.  Discussed prescriptions and follow up appointments with the patient. Prescription given to patient, medication list explained in detail. Pt verbalized understanding.  Allergies as of 05/14/2017   No Known Allergies     Medication List    STOP taking these medications   diltiazem 180 MG 24 hr capsule Commonly known as:  TIAZAC     TAKE these medications   carvedilol 12.5 MG tablet Commonly known as:  COREG Take 12.5 mg by mouth 2 (two) times daily.   ELIQUIS 5 MG Tabs tablet Generic drug:  apixaban Take 5 mg by mouth 2 (two) times daily.   enalapril 20 MG tablet Commonly known as:  VASOTEC Take 20 mg by mouth 2 (two) times daily.   HUMALOG 100 UNIT/ML cartridge Generic drug:  insulin lispro Inject 12-18 Units into the skin 3 (three) times daily with meals.   hydrochlorothiazide 25 MG tablet Commonly known as:  HYDRODIURIL Take 25 mg by mouth daily.   LANTUS SOLOSTAR 100 UNIT/ML Solostar Pen Generic drug:  Insulin Glargine Inject 26 Units into the skin 2 (two) times daily.   levothyroxine 75 MCG tablet Commonly known as:  SYNTHROID, LEVOTHROID Take 75 mcg by mouth daily before breakfast.   magnesium oxide 400 MG tablet Commonly known as:  MAG-OX Take 400 mg by mouth every evening.   meclizine 25 MG tablet Commonly known as:  ANTIVERT Take 1 tablet (25 mg total) by mouth 3 (three) times daily as needed for dizziness.   metFORMIN 500 MG 24 hr tablet Commonly known as:  GLUCOPHAGE-XR Take 500 mg by mouth 2 (two) times daily.   pravastatin 20 MG tablet Commonly known as:  PRAVACHOL Take 20 mg by mouth at bedtime.   Vitamin D3 2000 units capsule Take 2,000 Units by mouth daily.       Vitals:   05/14/17 0753 05/14/17 1129  BP: 140/74 (!) 121/59  Pulse: (!) 59 (!) 54  Resp: 14 14  Temp: 98.1 F (36.7 C)   SpO2: 97% 98%    Tele box removed and returned.Skin clean, dry and intact without evidence of skin  break down, no evidence of skin tears noted. IV catheter discontinued intact. Site without signs and symptoms of complications. Dressing and pressure applied. Pt denies pain at this time. No complaints noted.  An After Visit Summary was printed and given to the patient. Patient escorted via WC, and D/C home via private auto.  Rigoberto NoelErica Y Elisheba Mcdonnell

## 2017-05-14 NOTE — Discharge Summary (Signed)
[facility] [place] [facility]   PATIENT NAME: Melinda Rodriguez    MR#:  [id number]  DATE OF BIRTH:  07/28/1945  DATE OF ADMISSION:  05/12/2017   ADMITTING PHYSICIAN: Enid Baasadhika Kalisetti, MD  DATE OF DISCHARGE:  05/14/2017 PRIMARY CARE PHYSICIAN: Cooner, Ruben GottronEdward William, MD   ADMISSION DIAGNOSIS:  dizziness DISCHARGE DIAGNOSIS:  Active Problems:   Symptomatic bradycardia  SECONDARY DIAGNOSIS:   Past Medical History:  Diagnosis Date  . A-fib (HCC)   . Diabetes mellitus without complication (HCC)   . Dyspnea    DOE  . Dysrhythmia    AFIB  . Edema   . Hypertension   . Hypothyroidism   . Palpitations    HOSPITAL COURSE:   Melinda Rodriguez  is a 71 y.o. female with a known history of atrial fibrillation on eliquis, diabetes mellitus, hypertension, hypothyroidism presents to hospital secondary to extreme dizziness that started this morning.  1. Dizziness-likely symptomatic bradycardia. CT of the head did not show any acute findings. -Gentle hydration for dehydration. Hold Coreg and Cardizem. Per Dr. Welton FlakesKhan, No longer has bradycardia and labs have improved and may go home with f/u 1 week in office. Added prn Antivert. But the patient still felt dizzy, [facility]   She feels much better with antivert.   2. Atrial fibrillation with slow ventricular response. Potassium is within normal limits. Coreg and Cardizem were hold. Resume coreg per Dr. Welton FlakesKhan. -Continue eliquis for anticoagulation. Echo: Normal LVEF, but dilated left atrium and Right ventricles. No   thrombii seen in LA/LV.  3. Diabetes mellitus-on Lantus, metformin and added sliding scale insulin as well.  4. Hypothyroidism-continue Synthroid.  Hypomagnesemia.  Improved with magnesium supplement.  DISCHARGE CONDITIONS:  [facility] CONSULTS OBTAINED:  Treatment Team:  Laurier NancyKhan, Shaukat A, MD DRUG ALLERGIES:  No Known Allergies DISCHARGE MEDICATIONS:    Allergies as of 05/14/2017   No Known Allergies     Medication List    STOP taking these medications   diltiazem 180 MG 24 hr capsule Commonly known as:  TIAZAC     TAKE these medications   carvedilol 12.5 MG tablet Commonly known as:  COREG Take 12.5 mg by mouth 2 (two) times daily.   ELIQUIS 5 MG Tabs tablet Generic drug:  apixaban Take 5 mg by mouth 2 (two) times daily.   enalapril 20 MG tablet Commonly known as:  VASOTEC Take 20 mg by mouth 2 (two) times daily.   HUMALOG 100 UNIT/ML cartridge Generic drug:  insulin lispro Inject 12-18 Units into the skin 3 (three) times daily with meals.   hydrochlorothiazide 25 MG tablet Commonly known as:  HYDRODIURIL Take 25 mg by mouth daily.   LANTUS SOLOSTAR 100 UNIT/ML Solostar Pen Generic drug:  Insulin Glargine Inject 26 Units into the skin 2 (two) times daily.   levothyroxine 75 MCG tablet Commonly known as:  SYNTHROID, LEVOTHROID Take 75 mcg by mouth daily before breakfast.   magnesium oxide 400 MG tablet Commonly known as:  MAG-OX Take 400 mg by mouth every evening.   meclizine 25 MG tablet Commonly known as:  ANTIVERT Take 1 tablet (25 mg total) by mouth 3 (three) times daily as needed for dizziness.   metFORMIN 500 MG 24 hr tablet Commonly known as:  GLUCOPHAGE-XR Take 500 mg by mouth 2 (two) times daily.   pravastatin 20 MG tablet Commonly known as:  PRAVACHOL Take 20 mg by mouth at bedtime.  Vitamin D3 2000 units capsule Take 2,000 Units by mouth daily.        DISCHARGE INSTRUCTIONS:  See AVS.  If you experience worsening of your admission symptoms, develop shortness of breath, life threatening emergency, suicidal or homicidal thoughts you must seek medical attention immediately by calling 911 or calling your MD immediately  if symptoms less severe.  You Must read complete instructions/literature along with all the possible adverse reactions/side effects for all the Medicines you take and  that have been prescribed to you. Take any new Medicines after you have completely understood and accpet all the possible adverse reactions/side effects.   Please note  You were cared for by a hospitalist during your hospital stay. If you have any questions about your discharge medications or the care you received while you were in the hospital after you are discharged, you can call the unit and asked to speak with the hospitalist on call if the hospitalist that took care of you is not available. Once you are discharged, your primary care physician will handle any further medical issues. Please note that NO REFILLS for any discharge medications will be authorized once you are discharged, as it is imperative that you return to your primary care physician (or establish a relationship with a primary care physician if you do not have one) for your aftercare needs so that they can reassess your need for medications and monitor your lab values.    On the day of Discharge:  VITAL SIGNS:  Blood pressure (!) 121/59, pulse (!) 54, temperature 98.1 F (36.7 C), temperature source Oral, resp. rate 14, height 5\' 4"  (1.626 m), weight 235 lb (106.6 kg), SpO2 98 %. PHYSICAL EXAMINATION:  GENERAL:  71 y.o.-year-old patient lying in the bed with no acute distress.  EYES: Pupils equal, round, reactive to light and accommodation. No scleral icterus. Extraocular muscles intact.  HEENT: Head atraumatic, normocephalic. Oropharynx and nasopharynx clear.  NECK:  Supple, no jugular venous distention. No thyroid enlargement, no tenderness.  LUNGS: Normal breath sounds bilaterally, no wheezing, rales,rhonchi or crepitation. No use of accessory muscles of respiration.  CARDIOVASCULAR: S1, S2 normal. No murmurs, rubs, or gallops.  ABDOMEN: Soft, non-tender, non-distended. Bowel sounds present. No organomegaly or mass.  EXTREMITIES: No pedal edema, cyanosis, or clubbing.  NEUROLOGIC: Cranial nerves II through XII are intact.  Muscle strength 5/5 in all extremities. Sensation intact. Gait not checked.  PSYCHIATRIC: The patient is alert and oriented x 3.  SKIN: No obvious rash, lesion, or ulcer.  DATA REVIEW:   CBC Recent Labs  Lab 05/13/17 0342  WBC 8.8  HGB 13.8  HCT 40.1  PLT 305    Chemistries  Recent Labs  Lab 05/13/17 0342  NA 141  K 4.0  CL 106  CO2 27  GLUCOSE 115*  BUN 19  CREATININE 1.18*  CALCIUM 8.9  MG 1.9     Microbiology Results  No results found for this or any previous visit.  RADIOLOGY:  No results found.   Management plans discussed with the patient, family and they are in agreement.  CODE STATUS: Prior   TOTAL TIME TAKING CARE OF THIS PATIENT: 36 minutes.    Shaune PollackQing Veralyn Lopp M.D on 05/14/2017 at 5:12 PM  Between 7am to 6pm - Pager - [phone]  After 6pm go to [email or web address] - Social research officer, governmentpassword EPAS [facility]  [facility] [place] [facility]  Office  [phone]  CC: Primary care physician; Nell Rangeooner, Ruben GottronEdward William, MD   Note: This dictation was prepared with  Dragon dictation along with smaller Company secretary. Any transcriptional errors that result from this process are unintentional.

## 2018-09-24 IMAGING — CT CT HEAD W/O CM
3 series · 15 of 47 positions shown, 18 images · non-contrast
Comparison: None.

CLINICAL DATA: 71-year-old female with new onset dizziness. No
known injury.

EXAM:
CT HEAD WITHOUT CONTRAST
TECHNIQUE: Contiguous axial images were obtained from the base of the skull
through the vertex without intravenous contrast.

[Series 3: head wo · axial · 0.47mm/px · z∈[-157,-32]mm · 9 of 31 slices shown, 12 images]
[im 3/31  brain]
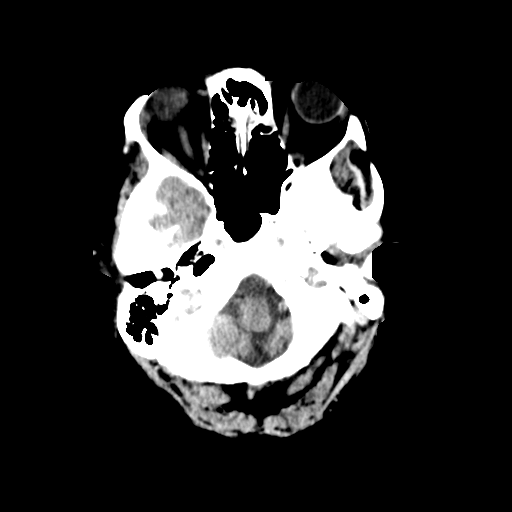
[im 3/31  bone]
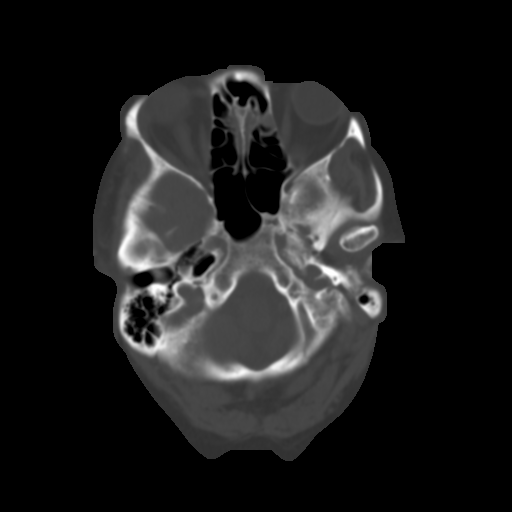
[im 6/31  brain]
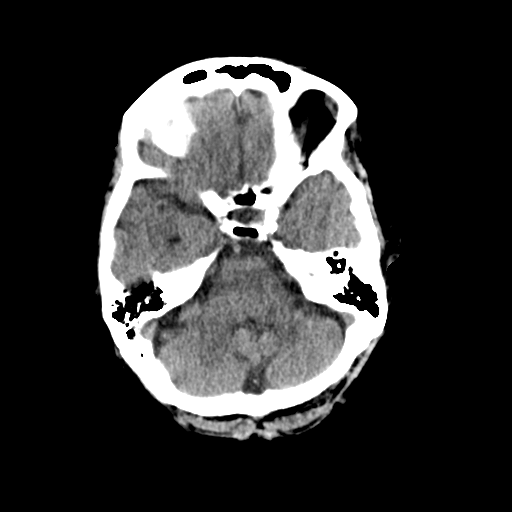
[im 9/31  brain]
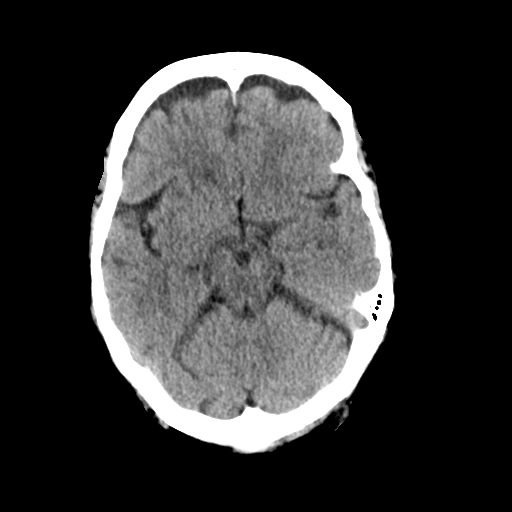
[im 12/31  brain]
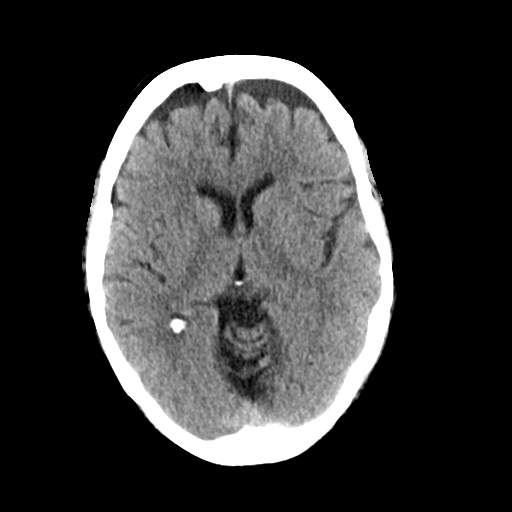
[im 16/31  brain]
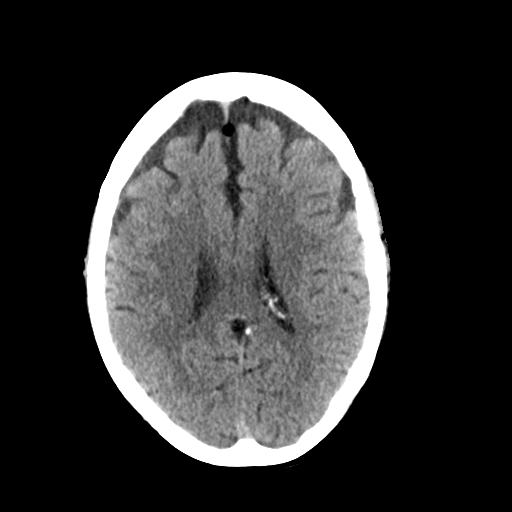
[im 16/31  bone]
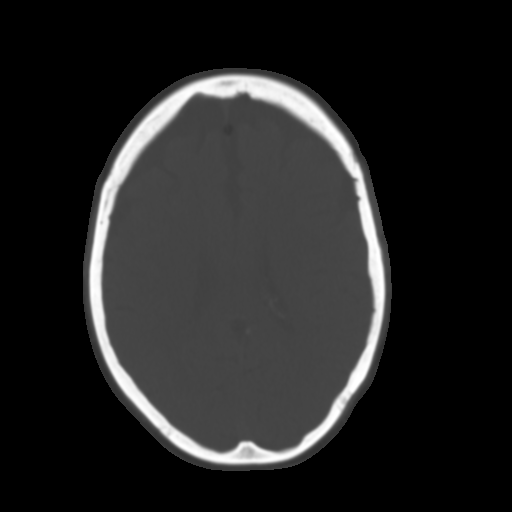
[im 19/31  brain]
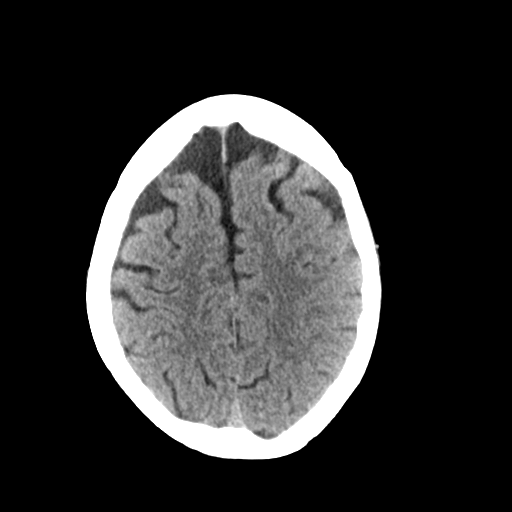
[im 22/31  brain]
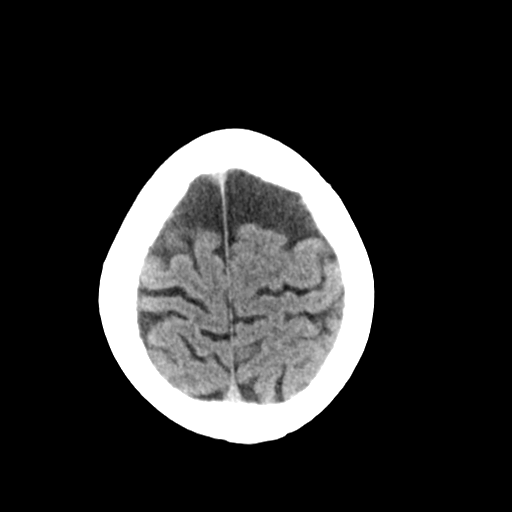
[im 25/31  brain]
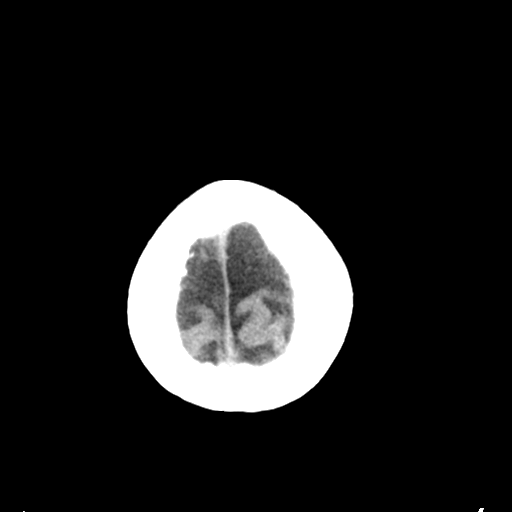
[im 28/31  brain]
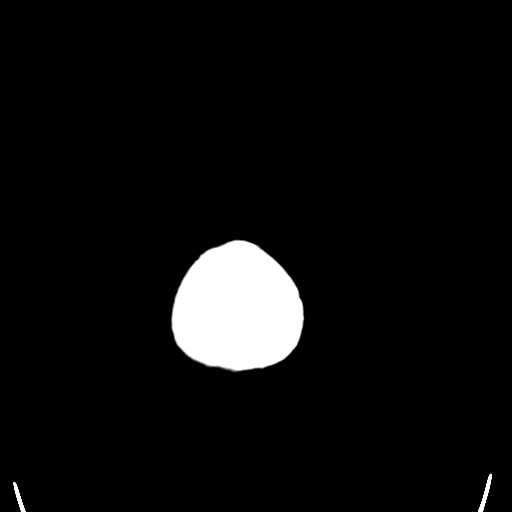
[im 28/31  bone]
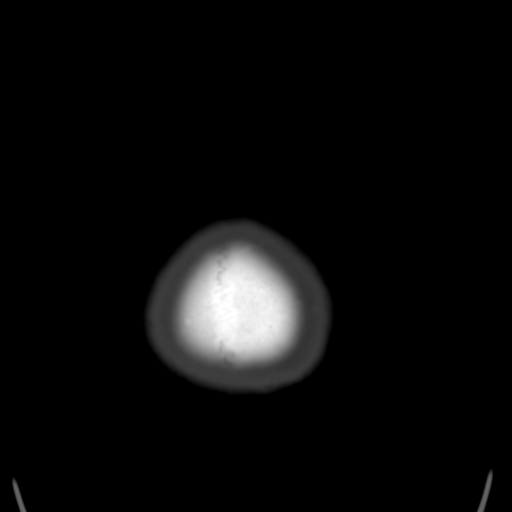

[Series 4: coronal soft tissue · coronal · 0.29mm/px · 3 of 68 slices shown]
[im 23/68  brain]
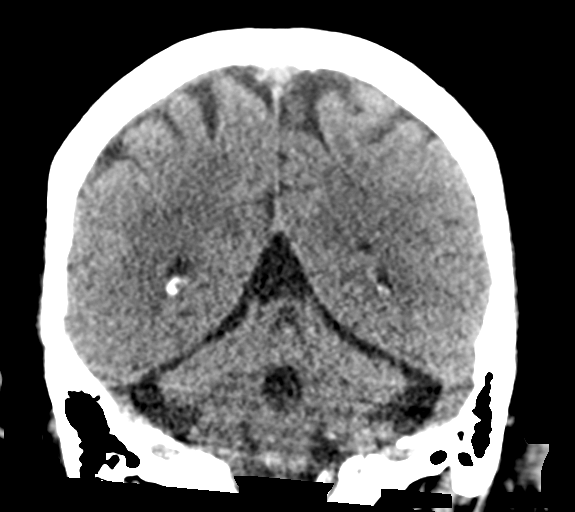
[im 30/68  brain]
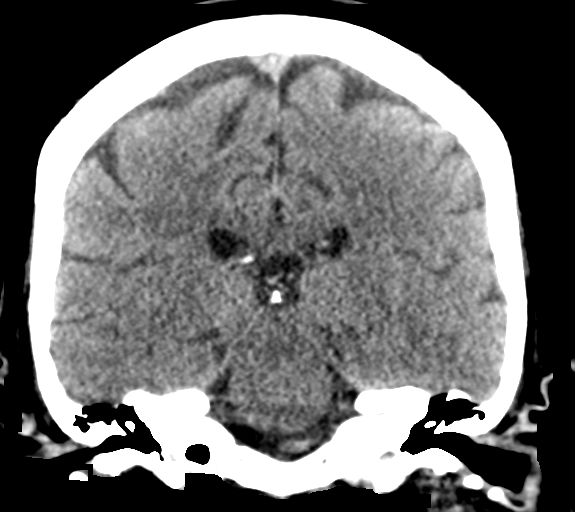
[im 38/68  brain]
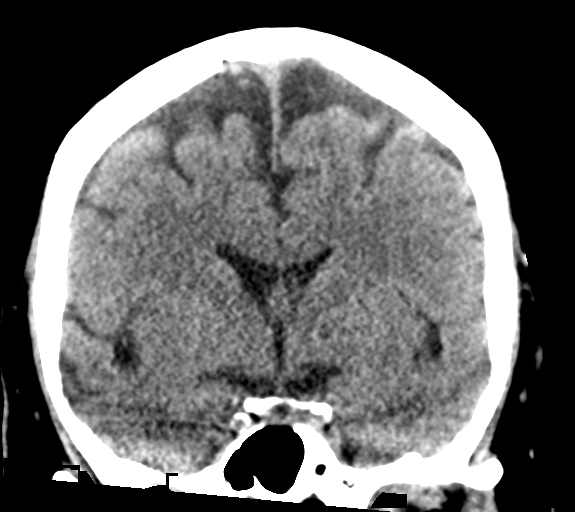

[Series 5: sagittal soft tissue · sagittal · 0.29mm/px · 3 of 50 slices shown]
[im 17/50  brain]
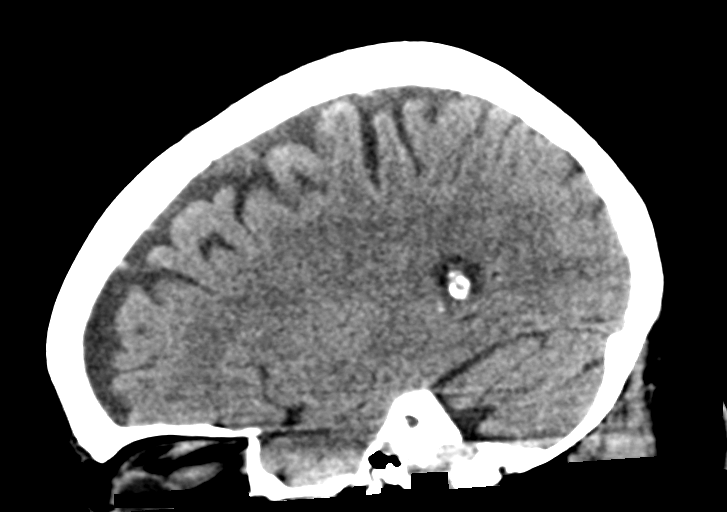
[im 25/50  brain]
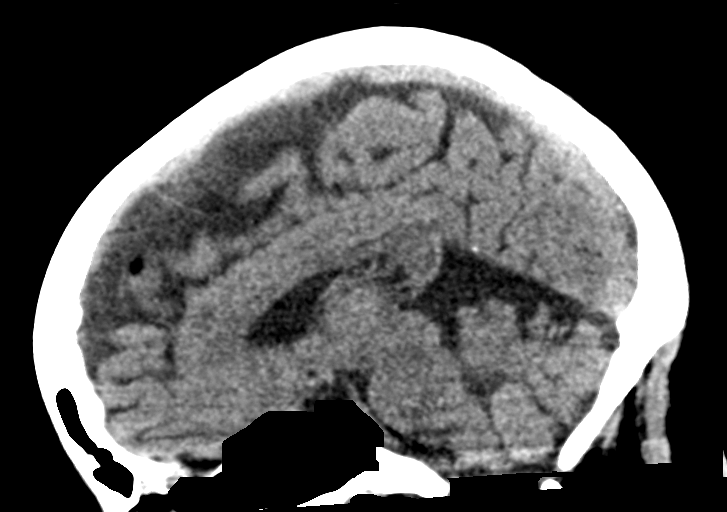
[im 33/50  brain]
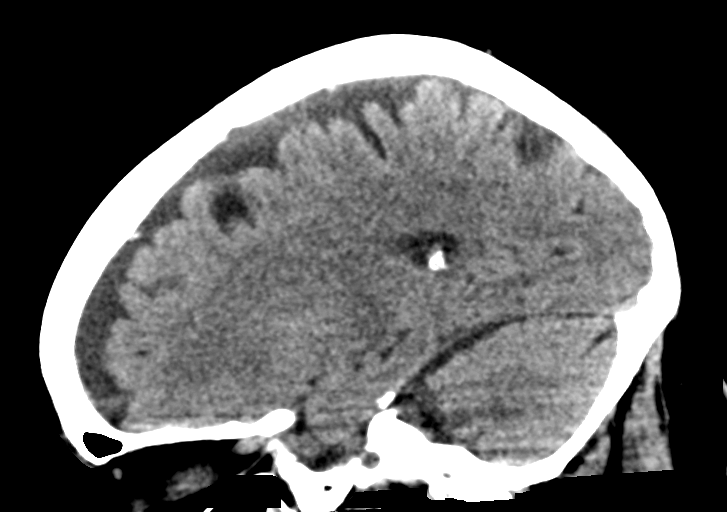

[15 of 47 positions shown; findings below may reference images not displayed]

FINDINGS: Brain: Cerebral volume is within normal limits for age. No midline
shift, ventriculomegaly, mass effect, evidence of mass lesion,
intracranial hemorrhage or evidence of cortically based acute
infarction. Gray-white matter differentiation is within normal
limits throughout the brain. Incidental small anterior
interhemispheric fissure congenital lipoma (normal variant).

Vascular: Calcified atherosclerosis at the skull base. No suspicious
intracranial vascular hyperdensity.

Skull: Intact.  No acute osseous abnormality identified.

Sinuses/Orbits: Partially visible fluid level versus mucosal
thickening in the left maxillary sinus. Mild left anterior ethmoid
mucosal thickening. Otherwise clear. Bilateral tympanic cavities are
clear.

Other: No acute orbit or scalp soft tissue findings.
IMPRESSION: 1.  Normal for age non contrast CT appearance of the brain.
2. Mild inflammatory changes in the left maxillary and ethmoid
sinuses.
